# Patient Record
Sex: Male | Born: 1960 | Race: Black or African American | Hispanic: No | Marital: Married | Smoking: Never smoker
Health system: Southern US, Community
[De-identification: ages and names within clinical notes are randomized; demographics above are authoritative.]

## PROBLEM LIST (undated history)

## (undated) DIAGNOSIS — I10 Essential (primary) hypertension: Secondary | ICD-10-CM

## (undated) DIAGNOSIS — G4700 Insomnia, unspecified: Secondary | ICD-10-CM

## (undated) DIAGNOSIS — G4730 Sleep apnea, unspecified: Secondary | ICD-10-CM

## (undated) DIAGNOSIS — H30132 Disseminated chorioretinal inflammation, generalized, left eye: Secondary | ICD-10-CM

## (undated) DIAGNOSIS — T7840XA Allergy, unspecified, initial encounter: Secondary | ICD-10-CM

## (undated) DIAGNOSIS — K449 Diaphragmatic hernia without obstruction or gangrene: Secondary | ICD-10-CM

## (undated) DIAGNOSIS — M4800 Spinal stenosis, site unspecified: Secondary | ICD-10-CM

## (undated) DIAGNOSIS — K219 Gastro-esophageal reflux disease without esophagitis: Secondary | ICD-10-CM

## (undated) DIAGNOSIS — N200 Calculus of kidney: Secondary | ICD-10-CM

## (undated) DIAGNOSIS — K5730 Diverticulosis of large intestine without perforation or abscess without bleeding: Secondary | ICD-10-CM

## (undated) DIAGNOSIS — F32A Depression, unspecified: Secondary | ICD-10-CM

## (undated) DIAGNOSIS — R413 Other amnesia: Secondary | ICD-10-CM

## (undated) HISTORY — DX: Depression, unspecified: F32.A

## (undated) HISTORY — DX: Allergy, unspecified, initial encounter: T78.40XA

## (undated) HISTORY — DX: Other amnesia: R41.3

## (undated) HISTORY — DX: Spinal stenosis, site unspecified: M48.00

## (undated) HISTORY — DX: Insomnia, unspecified: G47.00

## (undated) HISTORY — PX: CATARACT EXTRACTION: SUR2

## (undated) HISTORY — DX: Calculus of kidney: N20.0

## (undated) HISTORY — DX: Disseminated chorioretinal inflammation, generalized, left eye: H30.132

## (undated) HISTORY — PX: RETINAL LASER PROCEDURE: SHX2339

## (undated) HISTORY — DX: Gastro-esophageal reflux disease without esophagitis: K21.9

## (undated) HISTORY — DX: Sleep apnea, unspecified: G47.30

## (undated) HISTORY — PX: COLONOSCOPY: SHX174

## (undated) HISTORY — DX: Diaphragmatic hernia without obstruction or gangrene: K44.9

## (undated) HISTORY — DX: Diverticulosis of large intestine without perforation or abscess without bleeding: K57.30

## (undated) HISTORY — DX: Essential (primary) hypertension: I10

---

## 1999-08-26 ENCOUNTER — Ambulatory Visit (HOSPITAL_COMMUNITY): Admission: RE | Admit: 1999-08-26 | Discharge: 1999-08-26 | Payer: Self-pay | Admitting: Internal Medicine

## 1999-08-26 ENCOUNTER — Encounter: Payer: Self-pay | Admitting: Internal Medicine

## 1999-09-04 ENCOUNTER — Encounter: Admission: RE | Admit: 1999-09-04 | Discharge: 1999-09-29 | Payer: Self-pay | Admitting: Internal Medicine

## 2003-11-04 ENCOUNTER — Ambulatory Visit (HOSPITAL_COMMUNITY): Admission: RE | Admit: 2003-11-04 | Discharge: 2003-11-04 | Payer: Self-pay | Admitting: Ophthalmology

## 2004-09-08 ENCOUNTER — Ambulatory Visit: Payer: Self-pay | Admitting: Internal Medicine

## 2004-12-21 ENCOUNTER — Ambulatory Visit: Payer: Self-pay | Admitting: Internal Medicine

## 2005-02-25 ENCOUNTER — Ambulatory Visit: Payer: Self-pay | Admitting: Gastroenterology

## 2005-06-22 ENCOUNTER — Ambulatory Visit: Payer: Self-pay | Admitting: Internal Medicine

## 2005-06-29 ENCOUNTER — Ambulatory Visit: Payer: Self-pay | Admitting: Internal Medicine

## 2006-02-01 ENCOUNTER — Emergency Department (HOSPITAL_COMMUNITY): Admission: EM | Admit: 2006-02-01 | Discharge: 2006-02-01 | Payer: Self-pay | Admitting: Emergency Medicine

## 2006-08-11 ENCOUNTER — Ambulatory Visit: Payer: Self-pay | Admitting: Internal Medicine

## 2006-08-11 LAB — CONVERTED CEMR LAB
ALT: 21 units/L (ref 0–40)
AST: 21 units/L (ref 0–37)
Albumin: 4 g/dL (ref 3.5–5.2)
Alkaline Phosphatase: 46 units/L (ref 39–117)
BUN: 19 mg/dL (ref 6–23)
Basophils Absolute: 0 10*3/uL (ref 0.0–0.1)
Basophils Relative: 0.3 % (ref 0.0–1.0)
Bilirubin, Direct: 0.1 mg/dL (ref 0.0–0.3)
CO2: 30 meq/L (ref 19–32)
Calcium: 9.6 mg/dL (ref 8.4–10.5)
Chloride: 104 meq/L (ref 96–112)
Cholesterol: 212 mg/dL (ref 0–200)
Creatinine, Ser: 1.6 mg/dL — ABNORMAL HIGH (ref 0.4–1.5)
Direct LDL: 135.4 mg/dL
Eosinophils Absolute: 0 10*3/uL (ref 0.0–0.6)
Eosinophils Relative: 0.8 % (ref 0.0–5.0)
GFR calc Af Amer: 60 mL/min
GFR calc non Af Amer: 49 mL/min
Glucose, Bld: 88 mg/dL (ref 70–99)
HCT: 46.1 % (ref 39.0–52.0)
HDL: 40.2 mg/dL (ref >39.0)
Hemoglobin: 16.1 g/dL (ref 13.0–17.0)
Lymphocytes Relative: 39.1 % (ref 12.0–46.0)
MCHC: 34.9 g/dL (ref 30.0–36.0)
MCV: 92.5 fL (ref 78.0–100.0)
Monocytes Absolute: 0.4 10*3/uL (ref 0.2–0.7)
Monocytes Relative: 9.1 % (ref 3.0–11.0)
Neutro Abs: 2.6 10*3/uL (ref 1.4–7.7)
Neutrophils Relative %: 50.7 % (ref 43.0–77.0)
PSA: 0.92 ng/mL (ref 0.10–4.00)
Platelets: 241 10*3/uL (ref 150–400)
Potassium: 3.9 meq/L (ref 3.5–5.1)
RBC: 4.98 M/uL (ref 4.22–5.81)
RDW: 12.8 % (ref 11.5–14.6)
Sodium: 139 meq/L (ref 135–145)
TSH: 0.89 microintl units/mL (ref 0.35–5.50)
Total Bilirubin: 0.9 mg/dL (ref 0.3–1.2)
Total CHOL/HDL Ratio: 5.3
Total Protein: 7.1 g/dL (ref 6.0–8.3)
Triglycerides: 173 mg/dL — ABNORMAL HIGH (ref 0–149)
VLDL: 35 mg/dL (ref 0–40)
WBC: 4.9 10*3/uL (ref 4.5–10.5)

## 2006-08-18 ENCOUNTER — Ambulatory Visit: Payer: Self-pay | Admitting: Internal Medicine

## 2007-01-03 ENCOUNTER — Ambulatory Visit: Payer: Self-pay | Admitting: Gastroenterology

## 2007-02-03 ENCOUNTER — Ambulatory Visit: Payer: Self-pay | Admitting: Gastroenterology

## 2007-02-03 ENCOUNTER — Encounter: Payer: Self-pay | Admitting: Internal Medicine

## 2007-02-28 ENCOUNTER — Ambulatory Visit: Payer: Self-pay | Admitting: Gastroenterology

## 2007-03-17 DIAGNOSIS — J309 Allergic rhinitis, unspecified: Secondary | ICD-10-CM | POA: Insufficient documentation

## 2007-03-17 DIAGNOSIS — I10 Essential (primary) hypertension: Secondary | ICD-10-CM | POA: Insufficient documentation

## 2007-05-23 ENCOUNTER — Encounter: Payer: Self-pay | Admitting: Internal Medicine

## 2007-05-29 ENCOUNTER — Telehealth: Payer: Self-pay | Admitting: Internal Medicine

## 2007-06-07 ENCOUNTER — Ambulatory Visit: Payer: Self-pay | Admitting: Internal Medicine

## 2007-06-07 DIAGNOSIS — E782 Mixed hyperlipidemia: Secondary | ICD-10-CM | POA: Insufficient documentation

## 2007-06-07 LAB — CONVERTED CEMR LAB
Cholesterol: 216 mg/dL (ref 0–200)
Direct LDL: 130.7 mg/dL
HDL: 35.9 mg/dL — ABNORMAL LOW (ref >39.0)
Total CHOL/HDL Ratio: 6
Triglycerides: 185 mg/dL — ABNORMAL HIGH (ref 0–149)
VLDL: 37 mg/dL (ref 0–40)

## 2007-06-12 ENCOUNTER — Ambulatory Visit: Payer: Self-pay | Admitting: Internal Medicine

## 2007-10-04 LAB — HM COLONOSCOPY: HM Colonoscopy: NORMAL

## 2008-01-22 ENCOUNTER — Telehealth: Payer: Self-pay | Admitting: Internal Medicine

## 2008-01-22 ENCOUNTER — Encounter: Payer: Self-pay | Admitting: Gastroenterology

## 2008-11-27 ENCOUNTER — Ambulatory Visit: Payer: Self-pay | Admitting: Internal Medicine

## 2008-11-27 LAB — CONVERTED CEMR LAB
ALT: 19 units/L (ref 0–53)
AST: 36 units/L (ref 0–37)
Albumin: 4.1 g/dL (ref 3.5–5.2)
Alkaline Phosphatase: 54 units/L (ref 39–117)
BUN: 19 mg/dL (ref 6–23)
Basophils Absolute: 0 10*3/uL (ref 0.0–0.1)
Basophils Relative: 1 % (ref 0.0–3.0)
Bilirubin Urine: NEGATIVE
Bilirubin, Direct: 0.1 mg/dL (ref 0.0–0.3)
Blood in Urine, dipstick: NEGATIVE
CO2: 32 meq/L (ref 19–32)
Calcium: 9.3 mg/dL (ref 8.4–10.5)
Chloride: 108 meq/L (ref 96–112)
Cholesterol: 200 mg/dL (ref 0–200)
Creatinine, Ser: 1.4 mg/dL (ref 0.4–1.5)
Eosinophils Absolute: 0 10*3/uL (ref 0.0–0.7)
Eosinophils Relative: 0.8 % (ref 0.0–5.0)
GFR calc non Af Amer: 69.12 mL/min (ref >60)
Glucose, Bld: 87 mg/dL (ref 70–99)
Glucose, Urine, Semiquant: NEGATIVE
HCT: 42 % (ref 39.0–52.0)
HDL: 37.2 mg/dL — ABNORMAL LOW (ref >39.00)
Hemoglobin: 14.4 g/dL (ref 13.0–17.0)
Ketones, urine, test strip: NEGATIVE
LDL Cholesterol: 138 mg/dL — ABNORMAL HIGH (ref 0–99)
Lymphocytes Relative: 45.2 % (ref 12.0–46.0)
Lymphs Abs: 1.8 10*3/uL (ref 0.7–4.0)
MCHC: 34.2 g/dL (ref 30.0–36.0)
MCV: 90.1 fL (ref 78.0–100.0)
Monocytes Absolute: 0.3 10*3/uL (ref 0.1–1.0)
Monocytes Relative: 8.8 % (ref 3.0–12.0)
Neutro Abs: 1.7 10*3/uL (ref 1.4–7.7)
Neutrophils Relative %: 44.2 % (ref 43.0–77.0)
Nitrite: NEGATIVE
PSA: 0.61 ng/mL (ref 0.10–4.00)
Platelets: 233 10*3/uL (ref 150.0–400.0)
Potassium: 3.9 meq/L (ref 3.5–5.1)
RBC: 4.66 M/uL (ref 4.22–5.81)
RDW: 13.9 % (ref 11.5–14.6)
Sodium: 144 meq/L (ref 135–145)
Specific Gravity, Urine: 1.02
TSH: 0.63 microintl units/mL (ref 0.35–5.50)
Total Bilirubin: 0.9 mg/dL (ref 0.3–1.2)
Total CHOL/HDL Ratio: 5
Total Protein: 7.3 g/dL (ref 6.0–8.3)
Triglycerides: 125 mg/dL (ref 0.0–149.0)
Urobilinogen, UA: 0.2
VLDL: 25 mg/dL (ref 0.0–40.0)
WBC Urine, dipstick: NEGATIVE
WBC: 3.8 10*3/uL — ABNORMAL LOW (ref 4.5–10.5)
pH: 5.5

## 2008-12-04 ENCOUNTER — Ambulatory Visit: Payer: Self-pay | Admitting: Internal Medicine

## 2008-12-20 ENCOUNTER — Encounter: Payer: Self-pay | Admitting: Gastroenterology

## 2009-01-17 ENCOUNTER — Telehealth: Payer: Self-pay | Admitting: Gastroenterology

## 2009-02-11 ENCOUNTER — Ambulatory Visit: Payer: Self-pay | Admitting: Gastroenterology

## 2010-05-07 ENCOUNTER — Ambulatory Visit: Payer: Self-pay | Admitting: Internal Medicine

## 2010-05-07 LAB — CONVERTED CEMR LAB
ALT: 21 units/L (ref 0–53)
AST: 26 units/L (ref 0–37)
Albumin: 4.2 g/dL (ref 3.5–5.2)
Alkaline Phosphatase: 57 units/L (ref 39–117)
BUN: 18 mg/dL (ref 6–23)
Basophils Absolute: 0 10*3/uL (ref 0.0–0.1)
Basophils Relative: 0.4 % (ref 0.0–3.0)
Bilirubin Urine: NEGATIVE
Bilirubin, Direct: 0.1 mg/dL (ref 0.0–0.3)
Blood in Urine, dipstick: NEGATIVE
CO2: 31 meq/L (ref 19–32)
Calcium: 9.7 mg/dL (ref 8.4–10.5)
Chloride: 101 meq/L (ref 96–112)
Cholesterol: 208 mg/dL — ABNORMAL HIGH (ref 0–200)
Creatinine, Ser: 1.7 mg/dL — ABNORMAL HIGH (ref 0.4–1.5)
Direct LDL: 124.5 mg/dL
Eosinophils Absolute: 0 10*3/uL (ref 0.0–0.7)
Eosinophils Relative: 0.6 % (ref 0.0–5.0)
GFR calc non Af Amer: 56.45 mL/min (ref >60)
Glucose, Bld: 82 mg/dL (ref 70–99)
Glucose, Urine, Semiquant: NEGATIVE
HCT: 46.5 % (ref 39.0–52.0)
HDL: 39.7 mg/dL (ref >39.00)
Hemoglobin: 16.1 g/dL (ref 13.0–17.0)
Lymphocytes Relative: 41.7 % (ref 12.0–46.0)
Lymphs Abs: 2.2 10*3/uL (ref 0.7–4.0)
MCHC: 34.7 g/dL (ref 30.0–36.0)
MCV: 92.5 fL (ref 78.0–100.0)
Monocytes Absolute: 0.4 10*3/uL (ref 0.1–1.0)
Monocytes Relative: 7.4 % (ref 3.0–12.0)
Neutro Abs: 2.6 10*3/uL (ref 1.4–7.7)
Neutrophils Relative %: 49.9 % (ref 43.0–77.0)
Nitrite: NEGATIVE
PSA: 0.82 ng/mL (ref 0.10–4.00)
Platelets: 230 10*3/uL (ref 150.0–400.0)
Potassium: 3.9 meq/L (ref 3.5–5.1)
RBC: 5.03 M/uL (ref 4.22–5.81)
RDW: 13.8 % (ref 11.5–14.6)
Sodium: 139 meq/L (ref 135–145)
Specific Gravity, Urine: 1.02
TSH: 0.92 microintl units/mL (ref 0.35–5.50)
Total Bilirubin: 0.7 mg/dL (ref 0.3–1.2)
Total CHOL/HDL Ratio: 5
Total Protein: 7.2 g/dL (ref 6.0–8.3)
Triglycerides: 185 mg/dL — ABNORMAL HIGH (ref 0.0–149.0)
Urobilinogen, UA: 0.2
VLDL: 37 mg/dL (ref 0.0–40.0)
WBC Urine, dipstick: NEGATIVE
WBC: 5.2 10*3/uL (ref 4.5–10.5)
pH: 5.5

## 2010-05-14 ENCOUNTER — Ambulatory Visit: Payer: Self-pay | Admitting: Internal Medicine

## 2010-08-04 NOTE — Progress Notes (Signed)
Summary: Protonix switch  Phone Note Call from Patient Call back at [phone]   Caller: Patient Call For: Dr. Jarold Motto Reason for Call: Talk to Nurse Details for Reason: Protonix switch Summary of Call: pt wanting to change from Protonix to a cheaper drug... pt filled out needed paperwork for this request, but says he hasnt heard back from anyone regarding this matter Initial call taken by: Vallarie Mare,  January 17, 2009 9:48 AM  Follow-up for Phone Call        Left message for patient to call back Darcey Nora RN, [facility]  January 17, 2009 10:11 AM    New/Updated Medications: PANTOPRAZOLE SODIUM 40 MG  TBEC (PANTOPRAZOLE SODIUM) 1 each day 30 minutes before meal Prescriptions: PANTOPRAZOLE SODIUM 40 MG  TBEC (PANTOPRAZOLE SODIUM) 1 each day 30 minutes before meal  #90 x 0   Entered by:   Darcey Nora RN, CGRN   Authorized by:   Mardella Layman MD FACG,FAGA   Signed by:   Darcey Nora RN, CGRN on 01/17/2009   Method used:   Electronically to        [facility] (mail-order)             ,          Ph: [phone]       Fax: [phone]   RxID:   [id number]

## 2010-08-04 NOTE — Progress Notes (Signed)
Summary: lmtcb for appt  05/29/2007, 11-25,11-26  Phone Note Outgoing Call Call back at Home Phone [phone]   Call placed by: Lynann Beaver CMA,  May 29, 2007 12:55 PM Call placed to: Patient Summary of Call: called pt to schedule an appt for [employer] physical form to be filled per Dr. Cato Mulligan.  Form is on Triage Board. Initial call taken by: Lynann Beaver CMA,  May 29, 2007 12:55 PM  Follow-up for Phone Call        [facility] 11-25 ..................................................................Marland KitchenRudy Jew, RN  May 30, 2007 12:21 PM [facility] 11-26 ..................................................................Marland KitchenRudy Jew, RN  May 31, 2007 8:19 AM   Additional Follow-up for Phone Call Additional follow up Details #1::        PT IS [facility] FOR 06-07-07 11AM Additional Follow-up by: Heron Sabins,  May 31, 2007 8:44 AM

## 2010-08-04 NOTE — Assessment & Plan Note (Signed)
Summary: ppi refill/sheri   History of Present Illness Visit Type: Follow-up Visit Primary GI MD: Cody Bison MD FACP FAGA Primary Provider: Leeanne Simmons Requesting Provider: n/a Chief Complaint: To follow up for new PPI Pantoprazole, may need new rx sent to [facility]. 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 He has chronic GERD and is on regular Protonix daily and is currently asymptomatic. He specifically denies chest pain or dysphagia,weight loss, lower gastrointestinal or hepatobiliary problems. He denies abuse of alcohol, cigarettes, or NSAIDs. He is followed by Dr. Cato Simmons and primary care. He had a negative colonoscopy 2 years ago. Family history is otherwise noncontributory. He denies any specific food intolerances.   GI Review of Systems      Denies abdominal pain, acid reflux, belching, bloating, chest pain, dysphagia with liquids, dysphagia with solids, heartburn, loss of appetite, nausea, vomiting, vomiting blood, weight loss, and  weight gain.        Denies anal fissure, black tarry stools, change in bowel habit, constipation, diarrhea, diverticulosis, fecal incontinence, heme positive stool, hemorrhoids, irritable bowel syndrome, jaundice, light color stool, liver problems, rectal bleeding, and  rectal pain.    Current Medications (verified): 1)  Fexofenadine Hcl 180 Mg  Tabs (Fexofenadine Hcl) .... Once Daily 2)  Cialis 20 Mg Tabs (Tadalafil) .... Take 1/2-1 Tablet By Mouth Once A Day As Needed 3)  Lisinopril-Hydrochlorothiazide 20-25 Mg Tabs (Lisinopril-Hydrochlorothiazide) .... Take 1 Tablet By Mouth Once A Day 4)  Paroxetine Hcl 10 Mg Tabs (Paroxetine Hcl) .... Take 1 Tablet By Mouth Every Other Night 5)  Rhinocort Aqua 32 Mcg/act Susp (Budesonide (Nasal)) .... Spray 2 Spray Into Both Nostrils Once A Day 6)  Astelin 137 Mcg/spray  Soln  (Azelastine Hcl) .... Once Daily 7)  Acular 0.5 %  Soln (Ketorolac Tromethamine) .... Once Daily 8)  Lotemax 0.5 %  Susp (Loteprednol Etabonate) .... Once Daily 9)  Pantoprazole Sodium 40 Mg  Tbec (Pantoprazole Sodium) .Marland Kitchen.. 1 Each Day 30 Minutes Before Meal  Allergies (verified): No Known Drug Allergies  Past History:  Past medical, surgical, family and social histories (including risk factors) reviewed for relevance to current acute and chronic problems.  Past Medical History: Reviewed history from 12/04/2008 and no changes required. Allergic rhinitis GERD Hypertension Acute Retnial Necrosis (left)  Past Surgical History: Reviewed history from 03/17/2007 and no changes required. Cataract extraction  Family History: Reviewed history from 12/04/2008 and no changes required. father-htn mother - htn, Kidney CA No FH of Colon Cancer:  Social History: Reviewed history from 12/04/2008 and no changes required. 1 child healthy Never Smoked Alcohol use-yes occ Occupation: Emergency planning/management officer Illicit Drug Use - no Drug Use:  no  Review of Systems       The patient complains of allergy/sinus.  The patient denies anemia, anxiety-new, arthritis/joint pain, back pain, blood in urine, breast changes/lumps, change in vision, confusion, cough, coughing up blood, depression-new, fainting, fatigue, fever, headaches-new, hearing problems, heart murmur, heart rhythm changes, itching, menstrual pain, muscle pains/cramps, night sweats, nosebleeds, shortness of breath, skin rash, sleeping problems, sore throat, swelling of feet/legs, swollen lymph glands, thirst - excessive, urination - excessive, urination changes/pain, urine leakage, vision changes, and voice change.    Vital Signs:  Patient profile:   50 year old male Height:      70 inches Weight:      209 pounds BMI:  30.10 BSA:     2.13 Pulse rate:   80 / minute Pulse rhythm:   regular BP sitting:   110 / 70  (left arm)  Vitals  Entered By: Cody Simmons CMA (AAMA) (February 11, 2009 3:15 PM)  Physical Exam  General:  Well developed, well nourished, no acute distress.healthy appearing.   Head:  Normocephalic and atraumatic. Eyes:  PERRLA, no icterus.exam deferred to patient's ophthalmologist.   Neck:  Supple; no masses or thyromegaly. Lungs:  Clear throughout to auscultation. Heart:  Regular rate and rhythm; no murmurs, rubs,  or bruits. Abdomen:  Soft, nontender and nondistended. No masses, hepatosplenomegaly or hernias noted. Normal bowel sounds. Extremities:  No clubbing, cyanosis, edema or deformities noted. Neurologic:  Alert and  oriented x4;  grossly normal neurologically. Psych:  Alert and cooperative. Normal mood and affect.   Impression & Recommendations:  Problem # 1:  GERD (ICD-530.81) Assessment Improved Continue daily PPI therapy and antireflux regime. There is no need for repeat endoscopy at this time. Previous endoscopies have shown a large hiatal hernia. He is followed by Dr. Cato Simmons he does routine blood screening.  Problem # 2:  DIVERTICULOSIS, COLON (ICD-562.10) Assessment: Unchanged high-fiber diet as tolerated.  Problem # 3:  HYPERTENSION (ICD-401.9) Assessment: Improved blood pressure today is 110/70 and he is to continue all meds as outlined and reviewed his record.  Patient Instructions: 1)  Copy sent to : Cody Simmons 2)  Please continue current medications.  3)  Avoid foods high in acid content ( tomatoes, citrus juices, spicy foods) . Avoid eating within 3 to 4 hours of lying down or before exercising. Do not over eat; try smaller more frequent meals. Elevate head of bed four inches when sleeping.  4)  Renew Protonix 40 mg 30 minutes before the first meal of the day.  Appended Document: ppi refill/sheri    Prescriptions: PANTOPRAZOLE SODIUM 40 MG  TBEC (PANTOPRAZOLE SODIUM) 1 each day 30 minutes before meal  #90 x 3   Entered by:   Cody Simmons CMA (AAMA)    Authorized by:   Cody Layman MD FACG,FAGA   Signed by:   Cody Simmons CMA (AAMA) on 02/11/2009   Method used:   Electronically to        [facility] (mail-order)             ,          Ph: [phone]       Fax: [phone]   RxID:   [id number]

## 2010-08-04 NOTE — Letter (Signed)
Summary: medical exam  medical exam   Imported By: Kassie Mends 06/09/2007 10:07:46  _____________________________________________________________________  External Attachment:    Type:   Image     Comment:   medical exam

## 2010-08-04 NOTE — Assessment & Plan Note (Signed)
Summary: WANTS EKG/CCM    Chief Complaint:  needs EKG for form--also needs copy of tracing with interpretation.  History of Present Illness: needed ekg for form completion  Current Allergies (reviewed today): No known allergies         Complete Medication List: 1)  Fexofenadine Hcl 180 Mg Tabs (Fexofenadine hcl) .... Once daily 2)  Cialis 20 Mg Tabs (Tadalafil) .... Take 1/2-1 tablet by mouth once a day 3)  Lisinopril-hydrochlorothiazide 20-25 Mg Tabs (Lisinopril-hydrochlorothiazide) .... Take 1 tablet by mouth once a day 4)  Paroxetine Hcl 10 Mg Tabs (Paroxetine hcl) .... Take 1 tablet by mouth every other night 5)  Protonix 40 Mg Tbec (Pantoprazole sodium) .... Take 1 tablet by mouth every morning 6)  Rhinocort Aqua 32 Mcg/act Susp (Budesonide (nasal)) .... Spray 2 spray into both nostrils once a day 7)  Astelin 137 Mcg/spray Soln (Azelastine hcl) .... Once daily 8)  Acular 0.5 % Soln (Ketorolac tromethamine) .... Once daily 9)  Lotemax 0.5 % Susp (Loteprednol etabonate) .... Once daily     ]

## 2010-08-04 NOTE — Miscellaneous (Signed)
Summary: Protonix refill [facility]  Clinical Lists Changes  Medications: Changed medication from PROTONIX 40 MG TBEC (PANTOPRAZOLE SODIUM) Take 1 tablet by mouth every morning to PROTONIX 40 MG TBEC (PANTOPRAZOLE SODIUM) Take 1 tablet by mouth every morning - Signed Rx of PROTONIX 40 MG TBEC (PANTOPRAZOLE SODIUM) Take 1 tablet by mouth every morning;  #90 x 3;  Signed;  Entered by: Harlow Mares CMA;  Authorized by: Mardella Layman MD [facility];  Method used: Faxed to [facility], , , [place]  , Ph: , Fax: [phone]    Prescriptions: PROTONIX 40 MG TBEC (PANTOPRAZOLE SODIUM) Take 1 tablet by mouth every morning  #90 x 3   Entered by:   Harlow Mares CMA   Authorized by:   Mardella Layman MD FACG,FAGA   Signed by:   Harlow Mares CMA on 01/22/2008   Method used:   Faxed to ...       [facility]             , [place]         Ph:        Fax: [phone]   RxID:   [id number]

## 2010-08-04 NOTE — Procedures (Signed)
Summary: Gastroenterology EGD  Gastroenterology EGD   Imported By: Milford Cage CMA 10/05/2007 15:19:41  _____________________________________________________________________  External Attachment:    Type:   Image     Comment:   External Document

## 2010-08-04 NOTE — Assessment & Plan Note (Signed)
Summary: CPX // RS   Vital Signs:  Patient profile:   50 year old male Height:      70 inches Weight:      209 pounds BMI:     30.10 Temp:     98.7 degrees F oral Pulse rate:   76 / minute BP sitting:   104 / 76  (left arm) Cuff size:   large  Vitals Entered By: Alfred Levins, CMA (May 14, 2010 10:21 AM) CC: cpx   Primary Care Provider:  Leeanne Deed  CC:  cpx.  History of Present Illness: cpx  Current Problems (verified): 1)  Preventive Health Care  (ICD-V70.0) 2)  Hiatal Hernia  (ICD-553.3) 3)  Diverticulosis, Colon  (ICD-562.10) 4)  Mixed Hyperlipidemia  (ICD-272.2) 5)  Hypertension  (ICD-401.9) 6)  Allergic Rhinitis  (ICD-477.9)  Current Medications (verified): 1)  Claritin 10 Mg Tabs (Loratadine) .... Once Daily 2)  Cialis 20 Mg Tabs (Tadalafil) .... Take 1/2-1 Tablet By Mouth Once A Day As Needed 3)  Lisinopril-Hydrochlorothiazide 20-25 Mg Tabs (Lisinopril-Hydrochlorothiazide) .... Take 1 Tablet By Mouth Once A Day 4)  Paroxetine Hcl 10 Mg Tabs (Paroxetine Hcl) .... Take 1 Tablet By Mouth Every Other Night--Needs Office Visit For Additional Refills 5)  Rhinocort Aqua 32 Mcg/act Susp (Budesonide (Nasal)) .... Spray 2 Spray Into Both Nostrils Once A Day 6)  Astelin 137 Mcg/spray  Soln (Azelastine Hcl) .... Once Daily 7)  Acular 0.5 %  Soln (Ketorolac Tromethamine) .... Once Daily 8)  Lotemax 0.5 %  Susp (Loteprednol Etabonate) .... Once Daily 9)  Pantoprazole Sodium 40 Mg  Tbec (Pantoprazole Sodium) .Marland Kitchen.. 1 Each Day 30 Minutes Before Meal  Allergies (verified): No Known Drug Allergies  Past History:  Past Medical History: Last updated: 12/04/2008 Allergic rhinitis GERD Hypertension Acute Retnial Necrosis (left)  Past Surgical History: Last updated: 03/17/2007 Cataract extraction  Family History: Last updated: 02/11/2009 father-htn mother - htn, Kidney CA No FH of Colon Cancer:  Social History: Last updated: 02/11/2009 1 child  healthy Never Smoked Alcohol use-yes occ Occupation: Emergency planning/management officer Illicit Drug Use - no  Risk Factors: Smoking Status: never (12/04/2008)  Physical Exam  General:  alert and well-developed.   Head:  normocephalic and atraumatic.   Eyes:  pupils equal and pupils round.   Ears:  R ear normal and L ear normal.   Neck:  supple and full ROM.   Lungs:  normal respiratory effort and no intercostal retractions.   Heart:  normal rate and regular rhythm.   Abdomen:  Soft, nontender and nondistended. No masses, hepatosplenomegaly or hernias noted. Normal bowel sounds. Prostate:  no nodules and no asymmetry.   Neurologic:  cranial nerves II-XII intact and gait normal.     Impression & Recommendations:  Problem # 1:  PREVENTIVE HEALTH CARE (ICD-V70.0) health maintenance is up-to-date. He needs to work on weight loss. I've advised the low calorie diet. He should exercise daily for at least 45 minutes  Problem # 2:  MIXED HYPERLIPIDEMIA (ICD-272.2)  Labs Reviewed: SGOT: 26 (05/07/2010)   SGPT: 21 (05/07/2010)   HDL:39.70 (05/07/2010), 37.20 (11/27/2008)  LDL:138 (11/27/2008), DEL (06/07/2007)  Chol:208 (05/07/2010), 200 (11/27/2008)  Trig:185.0 (05/07/2010), 125.0 (11/27/2008)  Problem # 3:  HIATAL HERNIA (ICD-553.3)  His updated medication list for this problem includes:    Pantoprazole Sodium 40 Mg Tbec (Pantoprazole sodium) .Marland Kitchen... 1 each day 30 minutes before meal  Complete Medication List: 1)  Claritin 10 Mg Tabs (Loratadine) .... Once daily 2)  Cialis  20 Mg Tabs (Tadalafil) .... Take 1/2-1 tablet by mouth once a day as needed 3)  Lisinopril-hydrochlorothiazide 20-25 Mg Tabs (Lisinopril-hydrochlorothiazide) .... Take 1 tablet by mouth once a day 4)  Paroxetine Hcl 10 Mg Tabs (Paroxetine hcl) .... Take 1 tablet by mouth every other night--needs office visit for additional refills 5)  Rhinocort Aqua 32 Mcg/act Susp (Budesonide (nasal)) .... Spray 2 spray into both nostrils once a  day 6)  Astelin 137 Mcg/spray Soln (Azelastine hcl) .... Once daily 7)  Acular 0.5 % Soln (Ketorolac tromethamine) .... Once daily 8)  Lotemax 0.5 % Susp (Loteprednol etabonate) .... Once daily 9)  Pantoprazole Sodium 40 Mg Tbec (Pantoprazole sodium) .Marland Kitchen.. 1 each day 30 minutes before meal  Patient Instructions: 1)  3 months-Labs only 2)  bmet--995.2 3)  If we don't call you with results within 5 days call us back   Orders Added: 1)  Est. Patient 40-64 years [[id number]]     Preventive Care Screening  Colonoscopy:    Date:  10/04/2007    Next Due:  10/2017    Results:  normal-pt's report

## 2010-08-04 NOTE — Progress Notes (Signed)
Summary: [facility] 90 day Rx request  Phone Note Call from Patient Call back at Home Phone [phone]   Caller: Patient Call For: Swords/Jenkins Summary of Call: Pt dropped of at the office [facility] Rx paperwork request for 2 med he would like to begin getting through [facility].   Cialis 20 mg 90 day supply would save him money Paroxetine HCL 10 mg can be changed to Fluoxetine HCL 10 mg one daily would save him money  Initial call taken by: Sid Falcon LPN,  January 22, 2008 2:44 PM  Follow-up for Phone Call        Rx faxed to [facility].Patient notified.  Follow-up by: Gladis Riffle, RN,  February 14, 2008 12:40 PM      Prescriptions: PAROXETINE HCL 10 MG TABS (PAROXETINE HCL) Take 1 tablet by mouth every other night  #90 x 2   Entered by:   Gladis Riffle, RN   Authorized by:   Birdie Sons MD   Signed by:   Gladis Riffle, RN on 02/14/2008   Method used:   Faxed to ...       [facility]             , [place]         Ph:        Fax: [phone]   RxID:   [id number] CIALIS 20 MG TABS (TADALAFIL) Take 1/2-1 tablet by mouth once a day as needed  #18 x 2   Entered by:   Gladis Riffle, RN   Authorized by:   Birdie Sons MD   Signed by:   Gladis Riffle, RN on 02/14/2008   Method used:   Faxed to ...       [facility]             , [place]         Ph:        Fax: [phone]   RxID:   [id number]

## 2010-08-04 NOTE — Assessment & Plan Note (Signed)
Summary: CPX/MHF   Vital Signs:  Patient profile:   50 year old male Height:      70 inches Weight:      200 pounds BMI:     28.80 Pulse rate:   60 / minute Pulse rhythm:   regular Resp:     12 per minute BP sitting:   110 / 78  (left arm)  Vitals Entered By: Gladis Riffle, RN (December 04, 2008 10:10 AM)  Nutrition Counseling: Patient's BMI is greater than 25 and therefore counseled on weight management options.  History of Present Illness: cpx  new problem: right ankle pain---increase pain with activity---ongoing for two months. Medial aspect  of ankle. Tends to worsen weveral hours after vigorous activity    Preventive Screening-Counseling & Management  Alcohol-Tobacco     Smoking Status: never  Current Problems (verified): 1)  Hiatal Hernia  (ICD-553.3) 2)  Diverticulosis, Colon  (ICD-562.10) 3)  Mixed Hyperlipidemia  (ICD-272.2) 4)  Hypertension  (ICD-401.9) 5)  Gerd  (ICD-530.81) 6)  Allergic Rhinitis  (ICD-477.9)  Current Medications (verified): 1)  Fexofenadine Hcl 180 Mg  Tabs (Fexofenadine Hcl) .... Once Daily 2)  Cialis 20 Mg Tabs (Tadalafil) .... Take 1/2-1 Tablet By Mouth Once A Day As Needed 3)  Lisinopril-Hydrochlorothiazide 20-25 Mg Tabs (Lisinopril-Hydrochlorothiazide) .... Take 1 Tablet By Mouth Once A Day 4)  Paroxetine Hcl 10 Mg Tabs (Paroxetine Hcl) .... Take 1 Tablet By Mouth Every Other Night 5)  Protonix 40 Mg Tbec (Pantoprazole Sodium) .... Take 1 Tablet By Mouth Every Morning 6)  Rhinocort Aqua 32 Mcg/act Susp (Budesonide (Nasal)) .... Spray 2 Spray Into Both Nostrils Once A Day 7)  Astelin 137 Mcg/spray  Soln (Azelastine Hcl) .... Once Daily 8)  Acular 0.5 %  Soln (Ketorolac Tromethamine) .... Once Daily 9)  Lotemax 0.5 %  Susp (Loteprednol Etabonate) .... Once Daily  Allergies (verified): No Known Drug Allergies  Comments:  Nurse/Medical Assistant: cpx, labs done--c/o right ankle pain x 4 months, worse last month so states is  "unbearable" The patient's medications and allergies were reviewed with the patient and were updated in the Medication and Allergy Lists. Gladis Riffle, RN (December 04, 2008 10:11 AM)  Past History:  Past Surgical History: Last updated: 03/17/2007 Cataract extraction  Risk Factors: Smoking Status: never (12/04/2008)  Past Medical History: Allergic rhinitis GERD Hypertension Acute Retnial Necrosis (left)  Family History: father-htn mother - htn, Kidney CA  Social History: 1 child healthy Never Smoked Alcohol use-yes  Review of Systems       All other systems reviewed and were negative    Impression & Recommendations:  Problem # 1:  Preventive Health Care (ICD-V70.0)  Complete Medication List: 1)  Fexofenadine Hcl 180 Mg Tabs (Fexofenadine hcl) .... Once daily 2)  Cialis 20 Mg Tabs (Tadalafil) .... Take 1/2-1 tablet by mouth once a day as needed 3)  Lisinopril-hydrochlorothiazide 20-25 Mg Tabs (Lisinopril-hydrochlorothiazide) .... Take 1 tablet by mouth once a day 4)  Paroxetine Hcl 10 Mg Tabs (Paroxetine hcl) .... Take 1 tablet by mouth every other night 5)  Protonix 40 Mg Tbec (Pantoprazole sodium) .... Take 1 tablet by mouth every morning 6)  Rhinocort Aqua 32 Mcg/act Susp (Budesonide (nasal)) .... Spray 2 spray into both nostrils once a day 7)  Astelin 137 Mcg/spray Soln (Azelastine hcl) .... Once daily 8)  Acular 0.5 % Soln (Ketorolac tromethamine) .... Once daily 9)  Lotemax 0.5 % Susp (Loteprednol etabonate) .... Once daily  Other Orders: T-Ankle  Comp Right (73610TC)  Physical Exam General Appearance: well developed, well nourished, no acute distress Eyes: conjunctiva and lids normal, PERRL, EOMI, Ears, Nose, Mouth, Throat: TM clear, nares clear, oral exam WNL Neck: supple, no lymphadenopathy, no thyromegaly, no JVD Respiratory: clear to auscultation and percussion, respiratory effort normal Cardiovascular: regular rate and rhythm, S1-S2, no murmur, rub or  gallop, no bruits, peripheral pulses normal and symmetric, no cyanosis, clubbing, edema or varicosities Chest: no scars, masses, tenderness; no asymmetry, skin changes, nipple discharge, no gynecomastia   Gastrointestinal: soft, non-tender; no hepatosplenomegaly, masses; active bowel sounds all quadrants, guaiac negative stool; no masses, tenderness, hemorrhoids  Genitourinary: no hernia, testicular mass,  or prostate enlargement Lymphatic: no cervical, axillary or inguinal adenopathy Musculoskeletal: gait normal, muscle tone and strength WNL, no joint swelling, effusions, discoloration, crepitus  Skin: clear, good turgor, color WNL, no rashes, lesions, or ulcerations Neurologic: normal mental status, normal reflexes, normal strength, sensation, and motion Psychiatric: alert; oriented to person, place and time Other Exam:   Appended Document: CPX/MHF   Tetanus/Td Vaccine    Vaccine Type: Tdap    Site: left deltoid    Mfr: GlaxoSmithKline    Dose: 0.5 ml    Route: IM    Given by: Gladis Riffle, RN    Exp. Date: 08/28/2010    Lot #: [id number]

## 2010-08-04 NOTE — Procedures (Signed)
Summary: Gastroenterology colonoscopy  Gastroenterology colonoscopy   Imported By: Milford Cage CMA 10/05/2007 15:15:22  _____________________________________________________________________  External Attachment:    Type:   Image     Comment:   External Document

## 2010-08-04 NOTE — Assessment & Plan Note (Signed)
Summary: FORM COMPLETION/NJR   Vital Signs:  Patient Profile:   50 Years Old Male Height:     69.5 inches Weight:      208 pounds Temp:     98.3 degrees F oral Pulse rate:   80 / minute BP sitting:   118 / 80  (left arm)  Vitals Entered By: Gladis Riffle, RN (June 07, 2007 11:22 AM)                 Chief Complaint:  form completion--cpx done 02/08.  Current Allergies (reviewed today): No known allergies         Impression & Recommendations:  Problem # 1:  HYPERTENSION (ICD-401.9) 30 minute form completion. [employer] training. PPD His updated medication list for this problem includes:    Lisinopril-hydrochlorothiazide 20-25 Mg Tabs (Lisinopril-hydrochlorothiazide) .Marland Kitchen... Take 1 tablet by mouth once a day   Complete Medication List: 1)  Fexofenadine Hcl 180 Mg Tabs (Fexofenadine hcl) .... Once daily 2)  Cialis 20 Mg Tabs (Tadalafil) .... Take 1/2-1 tablet by mouth once a day 3)  Lisinopril-hydrochlorothiazide 20-25 Mg Tabs (Lisinopril-hydrochlorothiazide) .... Take 1 tablet by mouth once a day 4)  Paroxetine Hcl 10 Mg Tabs (Paroxetine hcl) .... Take 1 tablet by mouth every other night 5)  Protonix 40 Mg Tbec (Pantoprazole sodium) .... Take 1 tablet by mouth every morning 6)  Rhinocort Aqua 32 Mcg/act Susp (Budesonide (nasal)) .... Spray 2 spray into both nostrils once a day 7)  Astelin 137 Mcg/spray Soln (Azelastine hcl) .... Once daily 8)  Acular 0.5 % Soln (Ketorolac tromethamine) .... Once daily 9)  Lotemax 0.5 % Susp (Loteprednol etabonate) .... Once daily  Other Orders: Venipuncture (62952) TLB-Lipid Panel (80061-LIPID) TB Skin Test (84132)     ]  PPD Application    Vaccine Type: PPD    Site: right forearm    Mfr: Sanofi Pasteur    Dose: 0.1 ml    Route: ID    Given by: Gladis Riffle, RN    Exp. Date: 02/25/2009    Lot #: [id number]  PPD Results    Date of reading: 06/09/2007    Results: < 5mm    Interpretation: negative

## 2010-08-07 NOTE — Procedures (Signed)
Summary: Colonoscopy/[facility]  Colonoscopy/[facility]   Imported By: Lanelle Bal 05/14/2010 12:01:04  _____________________________________________________________________  External Attachment:    Type:   Image     Comment:   External Document

## 2010-08-11 ENCOUNTER — Other Ambulatory Visit (INDEPENDENT_AMBULATORY_CARE_PROVIDER_SITE_OTHER): Payer: 59 | Admitting: Internal Medicine

## 2010-08-11 DIAGNOSIS — T887XXA Unspecified adverse effect of drug or medicament, initial encounter: Secondary | ICD-10-CM

## 2010-08-11 LAB — BASIC METABOLIC PANEL
BUN: 21 mg/dL (ref 6–23)
CO2: 29 mEq/L (ref 19–32)
Calcium: 9.6 mg/dL (ref 8.4–10.5)
Chloride: 107 mEq/L (ref 96–112)
Creatinine, Ser: 1.6 mg/dL — ABNORMAL HIGH (ref 0.4–1.5)
GFR: 60.14 mL/min (ref >60.00)
Glucose, Bld: 59 mg/dL — ABNORMAL LOW (ref 70–99)
Potassium: 3.8 mEq/L (ref 3.5–5.1)
Sodium: 142 mEq/L (ref 135–145)

## 2010-10-06 ENCOUNTER — Other Ambulatory Visit: Payer: Self-pay | Admitting: Internal Medicine

## 2010-11-17 NOTE — Assessment & Plan Note (Signed)
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  He denies GI complaints except for occasional hiccups.  He denies  dysphagia or chest pain.  He has a family history of colon polyps in his  mother, but never had colonoscopy exam.  He denies lower  gastrointestinal problems.   He sees Dr. Cato Mulligan for his general medical care and is on some nasal  sprays, a medicine called Acular 3 times a day, lisinopril daily, and a  possible anti-depressant.  Did not bring his medications today for  review.   He weighs 207 pounds with blood pressure 112/80.  Pulse was 80 and  regular.  General physical exam was not performed.   ASSESSMENT:  1. Chronic gastroesophageal reflux disease - rule out Barrett's      mucosa.  2. Need for screening colonoscopy for his age and family history.   RECOMMENDATIONS:  1. Renew Protonix and use twice a day as needed.  2. Outpatient endoscopy and colonoscopy at his convenience.  3. Will better list his medications when he returns for colonoscopy      exam.  I have asked him to bring a list of his medications for that      visit.     Vania Rea. Jarold Motto, MD, Caleen Essex, FAGA  Electronically Signed    DRP/MedQ  DD: 01/03/2007  DT: 01/03/2007  Job #: [id number]   cc:   Valetta Mole. Swords, MD

## 2010-11-20 NOTE — Op Note (Signed)
NAMEKADYN, GUILD                        ACCOUNT NO.:  [id number]   MEDICAL RECORD NO.:  [id number]                   PATIENT TYPE:  OIB   LOCATION:  2889                                 FACILITY:  [facility]   PHYSICIAN:  Alford Highland. Rankin, M.D.                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   POSTOPERATIVE DIAGNOSIS:  1. Epiretinal membrane of the left eye with severe topographical distortion     of the macular region and the optic nerve.  2. 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   OPERATION PERFORMED:  1. Posterior vitrectomy and membrane peel, left eye.  2. Endolaser panphotocoagulation for retinopexy purposes.   SURGEON:  Alford Highland. Rankin, M.D.   ANESTHESIA:  General endotracheal.   INDICATIONS FOR PROCEDURE:  The patient is a 50 year old man who has  profound vision loss on the basis of severe macular topographical  distortion, anterior and posterior tractional retinal detachment on the  macular region. This was from vitreomacular retraction syndrome.  The  patient understands that this is an attempt to release the traction.  He  understands the risks of anesthesia including the rare occurrence of death  but also to the eye including but not limited to hemorrhage, infection,  scarring, need for another surgery, no change in vision, loss of vision and  progression of disease despite intervention.  After appropriate signed  consent was obtained, the patient was taken to the operating room.   DESCRIPTION OF PROCEDURE:  In the operating room, general endotracheal  anesthesia was instituted without difficulty.  The left periocular region  was sterilely prepped and draped in the usual  ophthalmic fashion.  A lid  speculum was applied.  Conjunctival peritomy was fashioned superiorly and  temporally.  A 4 mm infusion secured 3.5 mm posterior to the limbus in the  infratemporal quadrant.  Placement in the vitreous cavity verified visually.  Superior sclerotomies then fashioned.  Wild microscope was placed in  position with the Biom attachment.  Core vitrectomy was then begun.  A  modified en bloc technique was then used to trim the severe epiretinal  tissues off the macular region.  No attempt was made to release the traction  on the optic nerve because of its densely adherent nature and the risks of  vascular damage.  This area was trimmed, however.  Peripheral vitreous was  then elevated to the midperiphery where dense vitreoretinal attachments and  scarring precluded any attempt at aggressive release of the traction  anterior to the equator.  Endolaser panphotocoagulation was placed posterior  to the previous chorioretinal scarring.  Peripheral retina was inspected and  found to be without new holes or tears.  The instruments  were removed from the eye.  Superior sclerotomies were then closed with 7-0  Vicryl. Conjunctiva was also closed.  The infusion removed and similarly  closed.  Subconjunctival injection of antibiotic and steroid applied.  The  patient tolerated the procedure well without complication.                                               Alford Highland Rankin, M.D.    GAR/MEDQ  D:  11/04/2003  T:  11/05/2003  Job:  [id number]   cc:   Remi Deter L. Chales Abrahams, M.D.  [address]erryn[address], [place]  [place]  [place] [address]  Fax: [phone]

## 2011-03-31 ENCOUNTER — Other Ambulatory Visit: Payer: Self-pay | Admitting: Internal Medicine

## 2011-06-06 ENCOUNTER — Other Ambulatory Visit: Payer: Self-pay | Admitting: Internal Medicine

## 2011-06-19 ENCOUNTER — Other Ambulatory Visit: Payer: Self-pay | Admitting: Gastroenterology

## 2011-07-19 ENCOUNTER — Other Ambulatory Visit (INDEPENDENT_AMBULATORY_CARE_PROVIDER_SITE_OTHER): Payer: 59

## 2011-07-19 DIAGNOSIS — Z0000 Encounter for general adult medical examination without abnormal findings: Secondary | ICD-10-CM

## 2011-07-19 LAB — CBC WITH DIFFERENTIAL/PLATELET
Basophils Absolute: 0 10*3/uL (ref 0.0–0.1)
Basophils Relative: 0.7 % (ref 0.0–3.0)
Eosinophils Absolute: 0 10*3/uL (ref 0.0–0.7)
Eosinophils Relative: 0.8 % (ref 0.0–5.0)
HCT: 45 % (ref 39.0–52.0)
Hemoglobin: 15.4 g/dL (ref 13.0–17.0)
Lymphocytes Relative: 39.7 % (ref 12.0–46.0)
Lymphs Abs: 1.6 10*3/uL (ref 0.7–4.0)
MCHC: 34.3 g/dL (ref 30.0–36.0)
MCV: 92.5 fl (ref 78.0–100.0)
Monocytes Absolute: 0.3 10*3/uL (ref 0.1–1.0)
Monocytes Relative: 8.1 % (ref 3.0–12.0)
Neutro Abs: 2 10*3/uL (ref 1.4–7.7)
Neutrophils Relative %: 50.7 % (ref 43.0–77.0)
Platelets: 264 10*3/uL (ref 150.0–400.0)
RBC: 4.87 Mil/uL (ref 4.22–5.81)
RDW: 13.7 % (ref 11.5–14.6)
WBC: 4 10*3/uL — ABNORMAL LOW (ref 4.5–10.5)

## 2011-07-19 LAB — HEPATIC FUNCTION PANEL
ALT: 21 U/L (ref 0–53)
AST: 25 U/L (ref 0–37)
Albumin: 4.3 g/dL (ref 3.5–5.2)
Alkaline Phosphatase: 54 U/L (ref 39–117)
Bilirubin, Direct: 0.1 mg/dL (ref 0.0–0.3)
Total Bilirubin: 1.2 mg/dL (ref 0.3–1.2)
Total Protein: 7.2 g/dL (ref 6.0–8.3)

## 2011-07-19 LAB — BASIC METABOLIC PANEL
BUN: 14 mg/dL (ref 6–23)
CO2: 26 mEq/L (ref 19–32)
Calcium: 9.2 mg/dL (ref 8.4–10.5)
Chloride: 99 mEq/L (ref 96–112)
Creatinine, Ser: 1.6 mg/dL — ABNORMAL HIGH (ref 0.4–1.5)
GFR: 60.36 mL/min (ref >60.00)
Glucose, Bld: 94 mg/dL (ref 70–99)
Potassium: 3.5 mEq/L (ref 3.5–5.1)
Sodium: 139 mEq/L (ref 135–145)

## 2011-07-19 LAB — LIPID PANEL
Cholesterol: 237 mg/dL — ABNORMAL HIGH (ref 0–200)
HDL: 38.9 mg/dL — ABNORMAL LOW (ref >39.00)
Total CHOL/HDL Ratio: 6
Triglycerides: 169 mg/dL — ABNORMAL HIGH (ref 0.0–149.0)
VLDL: 33.8 mg/dL (ref 0.0–40.0)

## 2011-07-19 LAB — POCT URINALYSIS DIPSTICK
Bilirubin, UA: NEGATIVE
Blood, UA: NEGATIVE
Glucose, UA: NEGATIVE
Ketones, UA: NEGATIVE
Nitrite, UA: NEGATIVE
Protein, UA: NEGATIVE
Spec Grav, UA: 1.02
Urobilinogen, UA: 0.2
pH, UA: 5.5

## 2011-07-19 LAB — TSH: TSH: 0.37 u[IU]/mL (ref 0.35–5.50)

## 2011-07-19 LAB — LDL CHOLESTEROL, DIRECT: Direct LDL: 156 mg/dL

## 2011-07-19 LAB — PSA: PSA: 0.73 ng/mL (ref 0.10–4.00)

## 2011-07-26 ENCOUNTER — Encounter: Payer: Self-pay | Admitting: Internal Medicine

## 2011-07-26 ENCOUNTER — Ambulatory Visit (INDEPENDENT_AMBULATORY_CARE_PROVIDER_SITE_OTHER): Payer: 59 | Admitting: Internal Medicine

## 2011-07-26 DIAGNOSIS — E782 Mixed hyperlipidemia: Secondary | ICD-10-CM

## 2011-07-26 DIAGNOSIS — Z0000 Encounter for general adult medical examination without abnormal findings: Secondary | ICD-10-CM

## 2011-07-26 NOTE — Assessment & Plan Note (Signed)
Discussed need for improved diet, daily exerc ise and weight loss

## 2011-07-26 NOTE — Progress Notes (Signed)
Patient ID: Cody Simmons, male   DOB: 1960/07/14, 51 y.o.   MRN: [id number] cpx  Past Medical History  Diagnosis Date  . Allergy   . GERD (gastroesophageal reflux disease)   . Hypertension   . Retinal necrosis, acute, left     History   Social History  . Marital Status: Divorced    Spouse Name: N/A    Number of Children: N/A  . Years of Education: N/A   Occupational History  . Not on file.   Social History Main Topics  . Smoking status: Never Smoker   . Smokeless tobacco: Not on file  . Alcohol Use: Yes  . Drug Use: No  . Sexually Active:    Other Topics Concern  . Not on file   Social History Narrative  . No narrative on file    Past Surgical History  Procedure Date  . Cataract extraction     Family History  Problem Relation Age of Onset  . Hypertension Mother   . Cancer Mother     kidney  . Hypertension Father     No Known Allergies  Current Outpatient Prescriptions on File Prior to Visit  Medication Sig Dispense Refill  . lisinopril-hydrochlorothiazide (PRINZIDE,ZESTORETIC) 20-25 MG per tablet TAKE 1 TABLET DAILY  90 tablet  0  . pantoprazole (PROTONIX) 40 MG tablet Take one tablet by mouth once a day.... MUST HAVE OFFICE VISIT  90 tablet  0  . PARoxetine (PAXIL) 10 MG tablet TAKE 1 TABLET EVERY OTHER NIGHT. (NEED OFFICE VISIT FOR ADDITIONAL REFILLS)  90 tablet  1     patient denies chest pain, shortness of breath, orthopnea. Denies lower extremity edema, abdominal pain, change in appetite, change in bowel movements. Patient denies rashes, musculoskeletal complaints. No other specific complaints in a complete review of systems.   BP 122/82  Pulse 76  Temp(Src) 98.1 F (36.7 C) (Oral)  Ht 5\' 10"  (1.778 m)  Wt 203 lb (92.08 kg)  BMI 29.13 kg/m2 Well-developed male in no acute distress. HEENT exam atraumatic, normocephalic, extraocular muscles are intact. Conjunctivae are pink without exudate. Neck is supple without lymphadenopathy, thyromegaly,  jugular venous distention. Chest is clear to auscultation without increased work of breathing. Cardiac exam S1-S2 are regular. The PMI is normal. No significant murmurs or gallops. Abdominal exam active bowel sounds, soft, nontender. No abdominal bruits. Extremities no clubbing cyanosis or edema. Peripheral pulses are normal without bruits. Neurologic exam alert and oriented without any motor or sensory deficits.    A/P: Well Visit: health maint UTD

## 2011-07-29 ENCOUNTER — Other Ambulatory Visit: Payer: Self-pay | Admitting: Internal Medicine

## 2011-09-04 ENCOUNTER — Other Ambulatory Visit: Payer: Self-pay | Admitting: Internal Medicine

## 2011-09-17 ENCOUNTER — Telehealth: Payer: Self-pay | Admitting: Internal Medicine

## 2011-09-17 ENCOUNTER — Other Ambulatory Visit: Payer: Self-pay | Admitting: Gastroenterology

## 2011-09-17 DIAGNOSIS — I10 Essential (primary) hypertension: Secondary | ICD-10-CM

## 2011-09-17 MED ORDER — LISINOPRIL-HYDROCHLOROTHIAZIDE 20-25 MG PO TABS: 1.0000 | ORAL_TABLET | Freq: Every day | ORAL | Status: DC

## 2011-09-17 NOTE — Telephone Encounter (Signed)
30 day supply sent to [facility] and 90 day sent in to [facility] electronically

## 2011-09-17 NOTE — Telephone Encounter (Signed)
Pt req refill of lisinopril-hydrochlorothiazide (PRINZIDE,ZESTORETIC) 20-25 MG per tablet to [facility] mail order pharmacy. Pt is completely out med of med and needs this to also be called in to [facility] on [address].

## 2011-09-20 ENCOUNTER — Other Ambulatory Visit: Payer: Self-pay | Admitting: *Deleted

## 2011-09-20 DIAGNOSIS — I10 Essential (primary) hypertension: Secondary | ICD-10-CM

## 2011-09-20 MED ORDER — LISINOPRIL-HYDROCHLOROTHIAZIDE 20-25 MG PO TABS: 1.0000 | ORAL_TABLET | Freq: Every day | ORAL | Status: DC

## 2011-10-28 ENCOUNTER — Other Ambulatory Visit: Payer: Self-pay | Admitting: Internal Medicine

## 2011-11-01 ENCOUNTER — Other Ambulatory Visit: Payer: Self-pay | Admitting: Gastroenterology

## 2011-11-01 MED ORDER — PANTOPRAZOLE SODIUM 40 MG PO TBEC: DELAYED_RELEASE_TABLET | ORAL | Status: DC

## 2011-11-01 NOTE — Telephone Encounter (Signed)
rx sent, appt already made

## 2011-11-10 ENCOUNTER — Encounter: Payer: Self-pay | Admitting: *Deleted

## 2011-11-16 ENCOUNTER — Encounter: Payer: Self-pay | Admitting: Gastroenterology

## 2011-11-16 ENCOUNTER — Ambulatory Visit (INDEPENDENT_AMBULATORY_CARE_PROVIDER_SITE_OTHER): Payer: 59 | Admitting: Gastroenterology

## 2011-11-16 VITALS — BP 118/64 | HR 80 | Ht 70.0 in | Wt 205.0 lb

## 2011-11-16 DIAGNOSIS — K219 Gastro-esophageal reflux disease without esophagitis: Secondary | ICD-10-CM

## 2011-11-16 MED ORDER — PANTOPRAZOLE SODIUM 40 MG PO TBEC: DELAYED_RELEASE_TABLET | ORAL | Status: DC

## 2011-11-16 NOTE — Progress Notes (Signed)
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 He is up-to-date on her endoscopies and colonoscopy exams. He denies any reflux symptoms, dysphagia, or hepatobiliary complaints. His mild essential hypertension is well controlled on medications. He has regular bowel movements and denies melena, hematochezia, or abdominal pain. Review of recent labs showed normal CBC and metabolic profile.   Current Medications, Allergies, Past Medical History, Past Surgical History, Family History and Social History were reviewed in [facility] record.  Pertinent Review of Systems Negative   Physical Exam: Healthy-appearing patient in no distress. Blood pressure 118/64, pulse 80 and regular, and weight 205 pounds with BMI of 29.41. Physical exam shows no organomegaly, masses or tenderness. The patient was in full gear making further exam otherwise impossible. Mental status was normal.    Assessment and Plan: GERD under good control on daily PPI therapy. I have reviewed her reflux regime with him and have renewed his Protonix 40 mg a day. We'll see him yearly or when necessary as needed. He is up-to-date on his endoscopy and colonoscopy exams. Please send a copy this to Dr. Cato Mulligan. No diagnosis found.

## 2011-11-16 NOTE — Progress Notes (Signed)
History of Present Illness: This is a     Current Medications, Allergies, Past Medical History, Past Surgical History, Family History and Social History were reviewed in [facility] record.   Assessment and plan: No diagnosis found.

## 2011-11-16 NOTE — Patient Instructions (Signed)
Your prescription(s) have been sent to you pharmacy.   

## 2012-02-12 ENCOUNTER — Other Ambulatory Visit: Payer: Self-pay | Admitting: Internal Medicine

## 2012-07-26 ENCOUNTER — Other Ambulatory Visit (INDEPENDENT_AMBULATORY_CARE_PROVIDER_SITE_OTHER): Payer: 59

## 2012-07-26 DIAGNOSIS — Z0000 Encounter for general adult medical examination without abnormal findings: Secondary | ICD-10-CM

## 2012-07-26 LAB — POCT URINALYSIS DIPSTICK
Bilirubin, UA: NEGATIVE
Blood, UA: NEGATIVE
Glucose, UA: NEGATIVE
Ketones, UA: NEGATIVE
Leukocytes, UA: NEGATIVE
Nitrite, UA: NEGATIVE
Spec Grav, UA: 1.02
Urobilinogen, UA: 0.2
pH, UA: 6

## 2012-07-26 LAB — LIPID PANEL
Cholesterol: 187 mg/dL (ref 0–200)
HDL: 35.8 mg/dL — ABNORMAL LOW (ref >39.00)
Total CHOL/HDL Ratio: 5
Triglycerides: 391 mg/dL — ABNORMAL HIGH (ref 0.0–149.0)
VLDL: 78.2 mg/dL — ABNORMAL HIGH (ref 0.0–40.0)

## 2012-07-26 LAB — CBC WITH DIFFERENTIAL/PLATELET
Basophils Absolute: 0 10*3/uL (ref 0.0–0.1)
Basophils Relative: 0.6 % (ref 0.0–3.0)
Eosinophils Absolute: 0 10*3/uL (ref 0.0–0.7)
Eosinophils Relative: 1.1 % (ref 0.0–5.0)
HCT: 47.9 % (ref 39.0–52.0)
Hemoglobin: 16.4 g/dL (ref 13.0–17.0)
Lymphocytes Relative: 38.8 % (ref 12.0–46.0)
Lymphs Abs: 1.6 10*3/uL (ref 0.7–4.0)
MCHC: 34.2 g/dL (ref 30.0–36.0)
MCV: 91.5 fl (ref 78.0–100.0)
Monocytes Absolute: 0.3 10*3/uL (ref 0.1–1.0)
Monocytes Relative: 8.1 % (ref 3.0–12.0)
Neutro Abs: 2.1 10*3/uL (ref 1.4–7.7)
Neutrophils Relative %: 51.4 % (ref 43.0–77.0)
Platelets: 213 10*3/uL (ref 150.0–400.0)
RBC: 5.24 Mil/uL (ref 4.22–5.81)
RDW: 13.4 % (ref 11.5–14.6)
WBC: 4.1 10*3/uL — ABNORMAL LOW (ref 4.5–10.5)

## 2012-07-26 LAB — BASIC METABOLIC PANEL
BUN: 14 mg/dL (ref 6–23)
CO2: 30 mEq/L (ref 19–32)
Calcium: 9.4 mg/dL (ref 8.4–10.5)
Chloride: 102 mEq/L (ref 96–112)
Creatinine, Ser: 1.5 mg/dL (ref 0.4–1.5)
GFR: 62.91 mL/min (ref >60.00)
Glucose, Bld: 98 mg/dL (ref 70–99)
Potassium: 3.8 mEq/L (ref 3.5–5.1)
Sodium: 139 mEq/L (ref 135–145)

## 2012-07-26 LAB — LDL CHOLESTEROL, DIRECT: Direct LDL: 68.4 mg/dL

## 2012-07-26 LAB — HEPATIC FUNCTION PANEL
ALT: 24 U/L (ref 0–53)
AST: 24 U/L (ref 0–37)
Albumin: 4.1 g/dL (ref 3.5–5.2)
Alkaline Phosphatase: 58 U/L (ref 39–117)
Bilirubin, Direct: 0.1 mg/dL (ref 0.0–0.3)
Total Bilirubin: 1 mg/dL (ref 0.3–1.2)
Total Protein: 7 g/dL (ref 6.0–8.3)

## 2012-07-26 LAB — PSA: PSA: 0.77 ng/mL (ref 0.10–4.00)

## 2012-07-27 LAB — TSH: TSH: 0.65 u[IU]/mL (ref 0.35–5.50)

## 2012-08-02 ENCOUNTER — Encounter: Payer: 59 | Admitting: Internal Medicine

## 2012-08-02 ENCOUNTER — Telehealth: Payer: Self-pay | Admitting: Internal Medicine

## 2012-08-02 NOTE — Telephone Encounter (Signed)
Patient had to reschedule cpx due to inclement weather and would like a call with lab results. Please assist.

## 2012-08-18 ENCOUNTER — Encounter: Payer: 59 | Admitting: Internal Medicine

## 2012-08-28 ENCOUNTER — Other Ambulatory Visit: Payer: Self-pay | Admitting: *Deleted

## 2012-08-28 MED ORDER — PANTOPRAZOLE SODIUM 40 MG PO TBEC: DELAYED_RELEASE_TABLET | ORAL | Status: DC

## 2012-08-29 ENCOUNTER — Other Ambulatory Visit: Payer: Self-pay | Admitting: *Deleted

## 2012-08-29 DIAGNOSIS — I10 Essential (primary) hypertension: Secondary | ICD-10-CM

## 2012-08-29 MED ORDER — LISINOPRIL-HYDROCHLOROTHIAZIDE 20-25 MG PO TABS: 1.0000 | ORAL_TABLET | Freq: Every day | ORAL | Status: DC

## 2012-10-11 ENCOUNTER — Encounter: Payer: 59 | Admitting: Internal Medicine

## 2012-10-30 ENCOUNTER — Ambulatory Visit (INDEPENDENT_AMBULATORY_CARE_PROVIDER_SITE_OTHER): Payer: 59 | Admitting: Internal Medicine

## 2012-10-30 ENCOUNTER — Encounter: Payer: Self-pay | Admitting: Internal Medicine

## 2012-10-30 VITALS — BP 132/90 | HR 76 | Temp 98.6°F | Ht 70.0 in | Wt 197.0 lb

## 2012-10-30 DIAGNOSIS — Z0000 Encounter for general adult medical examination without abnormal findings: Secondary | ICD-10-CM

## 2012-10-30 DIAGNOSIS — E785 Hyperlipidemia, unspecified: Secondary | ICD-10-CM

## 2012-10-30 NOTE — Progress Notes (Signed)
Patient ID: Cody Simmons, male   DOB: 06/20/1961, 52 y.o.   MRN: [id number] cpx  Past Medical History  Diagnosis Date  . Allergy   . GERD (gastroesophageal reflux disease)   . Hypertension   . Retinal necrosis, acute, left   . Diverticulosis of colon (without mention of hemorrhage)   . Hiatal hernia     History   Social History  . Marital Status: Divorced    Spouse Name: N/A    Number of Children: N/A  . Years of Education: N/A   Occupational History  . Police Officer [employer]   Social History Main Topics  . Smoking status: Never Smoker   . Smokeless tobacco: Never Used  . Alcohol Use: No  . Drug Use: No  . Sexually Active: Not on file   Other Topics Concern  . Not on file   Social History Narrative  . No narrative on file    Past Surgical History  Procedure Laterality Date  . Cataract extraction      Family History  Problem Relation Age of Onset  . Hypertension Mother   . Kidney cancer Mother   . Hypertension Father     Allergies  Allergen Reactions  . Shellfish Allergy     Current Outpatient Prescriptions on File Prior to Visit  Medication Sig Dispense Refill  . CIALIS 20 MG tablet TAKE ONE-HALF (1/2) TO 1 TABLET ONCE A DAY AS NEEDED  18 tablet  5  . ketorolac (ACULAR) 0.5 % ophthalmic solution Place 1 drop into the left eye 2 (two) times daily.      Marland Kitchen lisinopril-hydrochlorothiazide (PRINZIDE,ZESTORETIC) 20-25 MG per tablet Take 1 tablet by mouth daily.  90 tablet  0  . loteprednol (LOTEMAX) 0.5 % ophthalmic suspension Place 1 drop into the left eye 4 (four) times daily.      . pantoprazole (PROTONIX) 40 MG tablet Take one tablet by mouth once a day  90 tablet  3  . PARoxetine (PAXIL) 10 MG tablet TAKE 1 TABLET EVERY OTHER NIGHT. (NEED OFFICE VISIT FOR ADDITIONAL REFILLS)  90 tablet  1   No current facility-administered medications on file prior to visit.     patient denies chest pain, shortness of breath, orthopnea. Denies lower  extremity edema, abdominal pain, change in appetite, change in bowel movements. Patient denies rashes, musculoskeletal complaints. No other specific complaints in a complete review of systems.   BP 132/90  Pulse 76  Temp(Src) 98.6 F (37 C) (Oral)  Ht 5\' 10"  (1.778 m)  Wt 197 lb (89.359 kg)  BMI 28.27 kg/m2   well-developed well-nourished male in no acute distress. HEENT exam atraumatic, normocephalic, neck supple without jugular venous distention. Chest clear to auscultation cardiac exam S1-S2 are regular. Abdominal exam overweight with bowel sounds, soft and nontender. Extremities no edema. Neurologic exam is alert with a normal gait.  A/p well visit- health maint utd Note TG-- exercise- weight loss and follow a low fat diet, minimize simple CHO

## 2012-11-24 ENCOUNTER — Other Ambulatory Visit: Payer: Self-pay | Admitting: *Deleted

## 2012-11-24 DIAGNOSIS — I10 Essential (primary) hypertension: Secondary | ICD-10-CM

## 2012-11-24 MED ORDER — LISINOPRIL-HYDROCHLOROTHIAZIDE 20-25 MG PO TABS: 1.0000 | ORAL_TABLET | Freq: Every day | ORAL | Status: DC

## 2013-01-22 ENCOUNTER — Other Ambulatory Visit: Payer: Self-pay | Admitting: *Deleted

## 2013-01-22 MED ORDER — PAROXETINE HCL 10 MG PO TABS: ORAL_TABLET | ORAL | Status: DC

## 2013-01-22 MED ORDER — TADALAFIL 20 MG PO TABS: ORAL_TABLET | ORAL | Status: DC

## 2013-03-01 ENCOUNTER — Other Ambulatory Visit (INDEPENDENT_AMBULATORY_CARE_PROVIDER_SITE_OTHER): Payer: 59

## 2013-03-01 DIAGNOSIS — E785 Hyperlipidemia, unspecified: Secondary | ICD-10-CM

## 2013-03-01 LAB — LIPID PANEL
Cholesterol: 196 mg/dL (ref 0–200)
HDL: 39.2 mg/dL (ref >39.00)
LDL Cholesterol: 127 mg/dL — ABNORMAL HIGH (ref 0–99)
Total CHOL/HDL Ratio: 5
Triglycerides: 150 mg/dL — ABNORMAL HIGH (ref 0.0–149.0)
VLDL: 30 mg/dL (ref 0.0–40.0)

## 2013-05-05 ENCOUNTER — Other Ambulatory Visit: Payer: Self-pay | Admitting: Internal Medicine

## 2013-09-09 ENCOUNTER — Other Ambulatory Visit: Payer: Self-pay | Admitting: Internal Medicine

## 2013-09-26 ENCOUNTER — Other Ambulatory Visit: Payer: Self-pay | Admitting: Internal Medicine

## 2013-09-26 ENCOUNTER — Other Ambulatory Visit: Payer: Self-pay | Admitting: Gastroenterology

## 2013-10-26 ENCOUNTER — Ambulatory Visit (INDEPENDENT_AMBULATORY_CARE_PROVIDER_SITE_OTHER): Payer: 59 | Admitting: Family Medicine

## 2013-10-26 ENCOUNTER — Encounter: Payer: Self-pay | Admitting: Family Medicine

## 2013-10-26 VITALS — BP 110/80 | HR 83 | Temp 98.8°F | Ht 70.0 in | Wt 206.0 lb

## 2013-10-26 DIAGNOSIS — M549 Dorsalgia, unspecified: Secondary | ICD-10-CM

## 2013-10-26 MED ORDER — NAPROXEN 500 MG PO TABS: 500.0000 mg | ORAL_TABLET | Freq: Two times a day (BID) | ORAL | Status: DC

## 2013-10-26 NOTE — Progress Notes (Signed)
No chief complaint on file.   HPI:  Low back pain: -low back pain on and off for 20 years -flares 2-3 times per year, has seen specialist in the past and tx with prednisone -this flare bilat low back pain worse with standing and bending, hamstrings tight too, pain level is 6/10, sharp -better with sitting and stretching -normally takes OTC meds for this, this spell lasting a little longer -usually triggered by standing a certain way -denies: weakness, numbness, fevers, malaise, bowel or bladder incontinence  ROS: See pertinent positives and negatives per HPI.  Past Medical History  Diagnosis Date  . Allergy   . GERD (gastroesophageal reflux disease)   . Hypertension   . Retinal necrosis, acute, left   . Diverticulosis of colon (without mention of hemorrhage)   . Hiatal hernia     Past Surgical History  Procedure Laterality Date  . Cataract extraction      Family History  Problem Relation Age of Onset  . Hypertension Mother   . Kidney cancer Mother   . Hypertension Father     History   Social History  . Marital Status: Divorced    Spouse Name: N/A    Number of Children: N/A  . Years of Education: N/A   Occupational History  . 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 Smoking status: Never Smoker   . Smokeless tobacco: Never Used  . Alcohol Use: No  . Drug Use: No  . Sexual Activity: None   Other Topics Concern  . None   Social History Narrative  . None    Current outpatient prescriptions:cetirizine (ZYRTEC) 10 MG tablet, Take 10 mg by mouth daily., Disp: , Rfl: ;  fluticasone (FLONASE) 50 MCG/ACT nasal spray, Place 1 spray into the nose daily., Disp: , Rfl: ;  ketorolac (ACULAR) 0.5 % ophthalmic solution, Place 1 drop into the left eye 2 (two) times daily., Disp: , Rfl: ;  lisinopril-hydrochlorothiazide (PRINZIDE,ZESTORETIC) 20-25 MG per tablet, Take 1 tablet by mouth  daily, Disp: 90 tablet, Rfl: 1 loteprednol (LOTEMAX) 0.5 % ophthalmic  suspension, Place 1 drop into the left eye 4 (four) times daily., Disp: , Rfl: ;  pantoprazole (PROTONIX) 40 MG tablet, Take one tablet by mouth once a day, Disp: 90 tablet, Rfl: 3;  PARoxetine (PAXIL) 10 MG tablet, Take 1 tablet by mouth  every other night, Disp: 45 tablet, Rfl: 0;  tadalafil (CIALIS) 20 MG tablet, TAKE ONE-HALF (1/2) TO 1 TABLET ONCE A DAY AS NEEDED, Disp: 18 tablet, Rfl: 1 naproxen (NAPROSYN) 500 MG tablet, Take 1 tablet (500 mg total) by mouth 2 (two) times daily with a meal., Disp: 30 tablet, Rfl: 0  EXAM:  Filed Vitals:   10/26/13 1604  BP: 110/80  Pulse: 83  Temp: 98.8 F (37.1 C)    Body mass index is 29.56 kg/(m^2).  GENERAL: vitals reviewed and listed above, alert, oriented, appears well hydrated and in no acute distress  HEENT: atraumatic, conjunttiva clear, no obvious abnormalities on inspection of external nose and ears  NECK: no obvious masses on inspection  LUNGS: clear to auscultation bilaterally, no wheezes, rales or rhonchi, good air movement  CV: HRRR, no peripheral edema  MS: moves all extremities without noticeable abnormality Normal Gait Normal inspection of back, no obvious scoliosis or leg length descrepancy No bony TTP Soft tissue TTP at: bilat lumbar paraspinal muscles and R PSIS -/+ tests: neg trendelenburg,+facet loading R, -SLRT, -CLRT, -FABER, -FADIR Normal muscle strength, sensation  to light touch and DTRs in LEs bilaterally  PSYCH: pleasant and cooperative, no obvious depression or anxiety  ASSESSMENT AND PLAN:  Discussed the following assessment and plan:  Back pain - Plan: Ambulatory referral to Orthopedic Surgery, naproxen (NAPROSYN) 500 MG tablet  -query DDD, spinal stenosis and possible R sacroiliitis, no sig neuro deficits on exam or alarm features -we discussed possible serious and likely etiologies, workup and treatment, treatment risks and return precautions -after this discussion, Kemauri opted for HEP, supportive  care and requested referral to [place] [facility] as feels symptoms have progressively worsened over time and wants to see specialist -follow up advised as needed -of course, we advised Robson  to return or notify a doctor immediately if symptoms worsen or persist or new concerns arise.  NOTE: wants to try naproxen but may call back for short course of prn opioid for pain if this is not working  -Patient advised to return or notify a doctor immediately if symptoms worsen or persist or new concerns arise.  Patient Instructions  -We placed a referral for you as discussed. It usually takes about 1-2 weeks to process and schedule this referral. If you have not heard from us regarding this appointment in 2 weeks please contact our office.  -do the exercises provided  -heat for 15 minute twice daily  -topical sports creams can be helpful for some patients  -us the naproxen as needed         Terressa KoyanagiHannah R. Brainard Highfill

## 2013-10-26 NOTE — Patient Instructions (Signed)
-  We placed a referral for you as discussed. It usually takes about 1-2 weeks to process and schedule this referral. If you have not heard from us regarding this appointment in 2 weeks please contact our office.  -do the exercises provided  -heat for 15 minute twice daily  -topical sports creams can be helpful for some patients  -us the naproxen as needed

## 2013-10-26 NOTE — Progress Notes (Signed)
Pre visit review using our clinic review tool, if applicable. No additional management support is needed unless otherwise documented below in the visit note. 

## 2013-11-12 ENCOUNTER — Other Ambulatory Visit: Payer: Self-pay | Admitting: Internal Medicine

## 2013-12-03 ENCOUNTER — Other Ambulatory Visit: Payer: Self-pay | Admitting: Gastroenterology

## 2013-12-04 ENCOUNTER — Other Ambulatory Visit: Payer: Self-pay | Admitting: Gastroenterology

## 2013-12-11 ENCOUNTER — Other Ambulatory Visit: Payer: Self-pay | Admitting: Gastroenterology

## 2013-12-11 ENCOUNTER — Telehealth: Payer: Self-pay | Admitting: Gastroenterology

## 2013-12-11 MED ORDER — PANTOPRAZOLE SODIUM 40 MG PO TBEC: DELAYED_RELEASE_TABLET | ORAL | Status: DC

## 2013-12-11 NOTE — Telephone Encounter (Signed)
OK 

## 2013-12-11 NOTE — Telephone Encounter (Signed)
Prescription sent to patient's pharmacy.

## 2013-12-11 NOTE — Telephone Encounter (Signed)
Dr. Russella Dar,  Patient has a follow up visit with you on 12-24-2013. Patient is requesting refill of Pantoprazole, is it okay to send?

## 2013-12-24 ENCOUNTER — Ambulatory Visit (INDEPENDENT_AMBULATORY_CARE_PROVIDER_SITE_OTHER): Payer: 59 | Admitting: Gastroenterology

## 2013-12-24 ENCOUNTER — Encounter: Payer: Self-pay | Admitting: Gastroenterology

## 2013-12-24 VITALS — BP 100/70 | HR 80 | Ht 69.0 in | Wt 197.2 lb

## 2013-12-24 DIAGNOSIS — Z1211 Encounter for screening for malignant neoplasm of colon: Secondary | ICD-10-CM

## 2013-12-24 DIAGNOSIS — K219 Gastro-esophageal reflux disease without esophagitis: Secondary | ICD-10-CM

## 2013-12-24 MED ORDER — PANTOPRAZOLE SODIUM 40 MG PO TBEC: DELAYED_RELEASE_TABLET | ORAL | Status: DC

## 2013-12-24 NOTE — Progress Notes (Signed)
    History of Present Illness: This is a 53 year old male with chronic GERD previously followed by Dr. Jarold MottoPatterson. His reflux symptoms are under excellent control on daily pantoprazole. He has no other gastrointestinal complaints. Denies weight loss, abdominal pain, constipation, diarrhea, change in stool caliber, melena, hematochezia, nausea, vomiting, dysphagia, reflux symptoms, chest pain.  Current Medications, Allergies, Past Medical History, Past Surgical History, Family History and Social History were reviewed in [facility] electronic medical record.  Physical Exam: General: Well developed , well nourished, no acute distress Head: Normocephalic and atraumatic Eyes:  sclerae anicteric, EOMI Ears: Normal auditory acuity Mouth: No deformity or lesions Lungs: Clear throughout to auscultation Heart: Regular rate and rhythm; no murmurs, rubs or bruits Abdomen: Soft, non tender and non distended. No masses, hepatosplenomegaly or hernias noted. Normal Bowel sounds Musculoskeletal: Symmetrical with no gross deformities  Pulses:  Normal pulses noted Extremities: No clubbing, cyanosis, edema or deformities noted Neurological: Alert oriented x 4, grossly nonfocal Psychological:  Alert and cooperative. Normal mood and affect  Assessment and Recommendations:  1. GERD. Continue standard antireflux measures and pantoprazole 40 mg daily.  2. Colorectal cancer screening, average risk. Routine 10 year interval, screening colonoscopy recommended August 2018.

## 2013-12-24 NOTE — Patient Instructions (Signed)
We have sent the following medications to your pharmacy for you to pick up at your convenience: pantoprazole   You are due for a recall colonoscopy in 2018 we will send you a letter in the mail to remind you.

## 2014-01-05 ENCOUNTER — Other Ambulatory Visit: Payer: Self-pay | Admitting: Internal Medicine

## 2014-01-07 ENCOUNTER — Other Ambulatory Visit (INDEPENDENT_AMBULATORY_CARE_PROVIDER_SITE_OTHER): Payer: 59

## 2014-01-07 DIAGNOSIS — Z0000 Encounter for general adult medical examination without abnormal findings: Secondary | ICD-10-CM

## 2014-01-07 LAB — BASIC METABOLIC PANEL
BUN: 18 mg/dL (ref 6–23)
CO2: 27 mEq/L (ref 19–32)
Calcium: 9.7 mg/dL (ref 8.4–10.5)
Chloride: 103 mEq/L (ref 96–112)
Creatinine, Ser: 1.5 mg/dL (ref 0.4–1.5)
GFR: 64.55 mL/min (ref >60.00)
Glucose, Bld: 109 mg/dL — ABNORMAL HIGH (ref 70–99)
Potassium: 3.3 mEq/L — ABNORMAL LOW (ref 3.5–5.1)
Sodium: 138 mEq/L (ref 135–145)

## 2014-01-07 LAB — CBC WITH DIFFERENTIAL/PLATELET
Basophils Absolute: 0 10*3/uL (ref 0.0–0.1)
Basophils Relative: 0.9 % (ref 0.0–3.0)
Eosinophils Absolute: 0.1 10*3/uL (ref 0.0–0.7)
Eosinophils Relative: 1.2 % (ref 0.0–5.0)
HCT: 44.5 % (ref 39.0–52.0)
Hemoglobin: 15.2 g/dL (ref 13.0–17.0)
Lymphocytes Relative: 55 % — ABNORMAL HIGH (ref 12.0–46.0)
Lymphs Abs: 2.6 10*3/uL (ref 0.7–4.0)
MCHC: 34.1 g/dL (ref 30.0–36.0)
MCV: 92.3 fl (ref 78.0–100.0)
Monocytes Absolute: 0.3 10*3/uL (ref 0.1–1.0)
Monocytes Relative: 6.6 % (ref 3.0–12.0)
Neutro Abs: 1.7 10*3/uL (ref 1.4–7.7)
Neutrophils Relative %: 36.3 % — ABNORMAL LOW (ref 43.0–77.0)
Platelets: 235 10*3/uL (ref 150.0–400.0)
RBC: 4.82 Mil/uL (ref 4.22–5.81)
RDW: 13.7 % (ref 11.5–15.5)
WBC: 4.7 10*3/uL (ref 4.0–10.5)

## 2014-01-07 LAB — LIPID PANEL
Cholesterol: 189 mg/dL (ref 0–200)
HDL: 41.4 mg/dL (ref >39.00)
LDL Cholesterol: 101 mg/dL — ABNORMAL HIGH (ref 0–99)
NonHDL: 147.6
Total CHOL/HDL Ratio: 5
Triglycerides: 234 mg/dL — ABNORMAL HIGH (ref 0.0–149.0)
VLDL: 46.8 mg/dL — ABNORMAL HIGH (ref 0.0–40.0)

## 2014-01-07 LAB — POCT URINALYSIS DIPSTICK
Bilirubin, UA: NEGATIVE
Blood, UA: NEGATIVE
Glucose, UA: NEGATIVE
Ketones, UA: NEGATIVE
Leukocytes, UA: NEGATIVE
Nitrite, UA: NEGATIVE
Protein, UA: NEGATIVE
Spec Grav, UA: 1.02
Urobilinogen, UA: 0.2
pH, UA: 5.5

## 2014-01-07 LAB — HEPATIC FUNCTION PANEL
ALT: 23 U/L (ref 0–53)
AST: 26 U/L (ref 0–37)
Albumin: 4.1 g/dL (ref 3.5–5.2)
Alkaline Phosphatase: 49 U/L (ref 39–117)
Bilirubin, Direct: 0.1 mg/dL (ref 0.0–0.3)
Total Bilirubin: 0.9 mg/dL (ref 0.2–1.2)
Total Protein: 6.8 g/dL (ref 6.0–8.3)

## 2014-01-07 LAB — PSA: PSA: 0.77 ng/mL (ref 0.10–4.00)

## 2014-01-07 LAB — TSH: TSH: 1.03 u[IU]/mL (ref 0.35–4.50)

## 2014-01-14 ENCOUNTER — Ambulatory Visit (INDEPENDENT_AMBULATORY_CARE_PROVIDER_SITE_OTHER): Payer: 59 | Admitting: Internal Medicine

## 2014-01-14 ENCOUNTER — Encounter: Payer: Self-pay | Admitting: Internal Medicine

## 2014-01-14 VITALS — BP 102/72 | HR 92 | Temp 98.7°F | Ht 70.0 in | Wt 196.0 lb

## 2014-01-14 DIAGNOSIS — Z0000 Encounter for general adult medical examination without abnormal findings: Secondary | ICD-10-CM

## 2014-01-14 MED ORDER — LISINOPRIL 20 MG PO TABS: 20.0000 mg | ORAL_TABLET | Freq: Every day | ORAL | Status: DC

## 2014-01-14 NOTE — Progress Notes (Signed)
Pre visit review using our clinic review tool, if applicable. No additional management support is needed unless otherwise documented below in the visit note. 

## 2014-01-14 NOTE — Progress Notes (Signed)
cpx  Past Medical History  Diagnosis Date  . Allergy   . GERD (gastroesophageal reflux disease)   . Hypertension   . Retinal necrosis, acute, left   . Diverticulosis of colon (without mention of hemorrhage)   . Hiatal hernia     History   Social History  . Marital Status: Divorced    Spouse Name: N/A    Number of Children: N/A  . Years of Education: N/A   Occupational History  . 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 Smoking status: Never Smoker   . Smokeless tobacco: Never Used  . Alcohol Use: No  . Drug Use: No  . Sexual Activity: Not on file   Other Topics Concern  . Not on file   Social History Narrative  . No narrative on file    Past Surgical History  Procedure Laterality Date  . Cataract extraction      Family History  Problem Relation Age of Onset  . Hypertension Mother   . Kidney cancer Mother   . Hypertension Father     Allergies  Allergen Reactions  . Shellfish Allergy     Current Outpatient Prescriptions on File Prior to Visit  Medication Sig Dispense Refill  . cetirizine (ZYRTEC) 10 MG tablet Take 10 mg by mouth daily.      . fluticasone (FLONASE) 50 MCG/ACT nasal spray Place 1 spray into the nose daily.      Marland Kitchen. ketorolac (ACULAR) 0.5 % ophthalmic solution Place 1 drop into the left eye 2 (two) times daily.      Marland Kitchen. lisinopril-hydrochlorothiazide (PRINZIDE,ZESTORETIC) 20-25 MG per tablet Take 1 tablet by mouth  daily  90 tablet  1  . loteprednol (LOTEMAX) 0.5 % ophthalmic suspension Place 1 drop into the left eye 4 (four) times daily.      . pantoprazole (PROTONIX) 40 MG tablet Take one tablet by mouth once a day  30 tablet  11  . PARoxetine (PAXIL) 10 MG tablet Take 1 tablet by mouth  every other night  45 tablet  0  . tadalafil (CIALIS) 20 MG tablet TAKE ONE-HALF (1/2) TO 1 TABLET ONCE A DAY AS NEEDED  18 tablet  1   No current facility-administered medications on file prior to visit.     patient denies chest pain,  shortness of breath, orthopnea. Denies lower extremity edema, abdominal pain, change in appetite, change in bowel movements. Patient denies rashes, musculoskeletal complaints. No other specific complaints in a complete review of systems.   BP 102/72  Pulse 92  Temp(Src) 98.7 F (37.1 C) (Oral)  Ht 5\' 10"  (1.778 m)  Wt 196 lb (88.905 kg)  BMI 28.12 kg/m2  well-developed well-nourished male in no acute distress. HEENT exam atraumatic, normocephalic, neck supple without jugular venous distention. Chest clear to auscultation cardiac exam S1-S2 are regular. Abdominal exam overweight with bowel sounds, soft and nontender. Extremities no edema. Neurologic exam is alert with a normal gait.  Well visit-  Health maint utd Note hypokalemia. Will change lisinopril/hct to just lisinopril (suspect hypokalemai will resolve)

## 2014-02-07 ENCOUNTER — Other Ambulatory Visit: Payer: Self-pay | Admitting: Internal Medicine

## 2014-05-09 ENCOUNTER — Telehealth: Payer: Self-pay | Admitting: Internal Medicine

## 2014-05-09 MED ORDER — PAROXETINE HCL 10 MG PO TABS: ORAL_TABLET | ORAL | Status: DC

## 2014-05-09 NOTE — Telephone Encounter (Signed)
[facility] - [place], [place] - [address] is requesting re-fill on PARoxetine (PAXIL) 10 MG tablet

## 2014-05-09 NOTE — Telephone Encounter (Signed)
rx sent in electronically 

## 2014-11-06 ENCOUNTER — Other Ambulatory Visit: Payer: Self-pay | Admitting: Internal Medicine

## 2014-11-25 ENCOUNTER — Other Ambulatory Visit: Payer: Self-pay | Admitting: Gastroenterology

## 2014-12-24 ENCOUNTER — Ambulatory Visit (INDEPENDENT_AMBULATORY_CARE_PROVIDER_SITE_OTHER): Payer: 59 | Admitting: Family Medicine

## 2014-12-24 ENCOUNTER — Encounter: Payer: Self-pay | Admitting: Family Medicine

## 2014-12-24 VITALS — BP 102/80 | HR 93 | Temp 98.5°F | Ht 70.0 in | Wt 199.0 lb

## 2014-12-24 DIAGNOSIS — I10 Essential (primary) hypertension: Secondary | ICD-10-CM

## 2014-12-24 DIAGNOSIS — E782 Mixed hyperlipidemia: Secondary | ICD-10-CM

## 2014-12-24 DIAGNOSIS — K219 Gastro-esophageal reflux disease without esophagitis: Secondary | ICD-10-CM | POA: Insufficient documentation

## 2014-12-24 DIAGNOSIS — F5221 Male erectile disorder: Secondary | ICD-10-CM

## 2014-12-24 DIAGNOSIS — J309 Allergic rhinitis, unspecified: Secondary | ICD-10-CM

## 2014-12-24 NOTE — Progress Notes (Signed)
HPI:  Cody Simmons is here to establish care.   Has the following chronic problems that require follow up and concerns today:  Hx DDD with Spinal Stenosis: -saw [place] [facility] and relieved with epidural inj in 2015 -doing well now -denies weakness, numbness  Seasonal allergist/shellfish allergy: -chronic -goes to allergist and gets allergy shots -meds: flonase and zyrtec  HTN/mild HLD: -dx a long time ago -on lisinopril  daily -denies: CP, SOB, swelling -no regular exercise, healthy diet  Hx of sexual anxiety: -on paxil  every other day for this -uses cialis rarely -denies: depression, SI, panic attacks  GERD: -hx of hiatal hernia -on PPI   -sees Dr. Russella Dar -denies hx stricture, dysphagia  Hx of Acute retinal necrosis: -sees Dr. Luciana Axe  ROS negative for unless reported above: fevers, unintentional weight loss, hearing or vision loss, chest pain, palpitations, struggling to breath, hemoptysis, melena, hematochezia, hematuria, falls, loc, si, thoughts of self harm  Past Medical History  Diagnosis Date  . Allergy   . GERD (gastroesophageal reflux disease)   . Hypertension   . Retinal necrosis, acute, left   . Diverticulosis of colon (without mention of hemorrhage)   . Hiatal hernia   . Spinal stenosis     saw [facility] [facility] in 2015 s/p epidural inj    Past Surgical History  Procedure Laterality Date  . Cataract extraction      Family History  Problem Relation Age of Onset  . Hypertension Mother   . Kidney cancer Mother   . Hypertension Father   . Hyperlipidemia Father     History   Social History  . Marital Status: Divorced    Spouse Name: N/A  . Number of Children: N/A  . Years of Education: N/A   Occupational History  . Emergency planning/management officer Unemployed   Social History Main Topics  . Smoking status: Never Smoker   . Smokeless tobacco: Never Used  . Alcohol Use: No  . Drug Use: No  . Sexual Activity: Not on file   Other Topics Concern   . None   Social History Narrative   Work or School: retired, Dispensing optician Situation: lives with wife      Spiritual Beliefs: Baptist      Lifestyle: no regular; diet is healthy           Current outpatient prescriptions:  .  cetirizine (ZYRTEC) 10 MG tablet, Take 10 mg by mouth daily., Disp: , Rfl:  .  CIALIS 20 MG tablet, Take 1/2 to 1 tablet by  mouth daily as needed, Disp: 15 tablet, Rfl: 11 .  fluticasone (FLONASE) 50 MCG/ACT nasal spray, Place 1 spray into the nose daily., Disp: , Rfl:  .  ketorolac (ACULAR) 0.5 % ophthalmic solution, Place 1 drop into the left eye 2 (two) times daily., Disp: , Rfl:  .  lisinopril (PRINIVIL,ZESTRIL) 20 MG tablet, Take 1 tablet (20 mg total) by mouth daily., Disp: 90 tablet, Rfl: 3 .  loteprednol (LOTEMAX) 0.5 % ophthalmic suspension, Place 1 drop into the left eye 4 (four) times daily., Disp: , Rfl:  .  pantoprazole (PROTONIX) 40 MG tablet, Take 1 tablet by mouth once a day, Disp: 90 tablet, Rfl: 0 .  PARoxetine (PAXIL) 10 MG tablet, Take 1 tablet by mouth  every other night, Disp: 45 tablet, Rfl: 2  EXAM:  Filed Vitals:   12/24/14 0941  BP: 102/80  Pulse: 93  Temp: 98.5 F (36.9 C)  Body mass index is 28.55 kg/(m^2).  GENERAL: vitals reviewed and listed above, alert, oriented, appears well hydrated and in no acute distress  HEENT: atraumatic, conjunttiva clear, no obvious abnormalities on inspection of external nose and ears  NECK: no obvious masses on inspection  LUNGS: clear to auscultation bilaterally, no wheezes, rales or rhonchi, good air movement  CV: HRRR, no peripheral edema  MS: moves all extremities without noticeable abnormality  PSYCH: pleasant and cooperative, no obvious depression or anxiety  ASSESSMENT AND PLAN:  Discussed the following assessment and plan:  Gastroesophageal reflux disease, esophagitis presence not specified  Erectile disorder, acquired, situational, mild  Mixed  hyperlipidemia  Essential hypertension  Allergic rhinitis, unspecified allergic rhinitis type  -We reviewed the PMH, PSH, FH, SH, Meds and Allergies. -We provided refills for any medications we will prescribe as needed. -We addressed current concerns per orders and patient instructions. -We have asked for records for pertinent exams, studies, vaccines and notes from previous providers. -We have advised patient to follow up per instructions below.   -Patient advised to return or notify a doctor immediately if symptoms worsen or persist or new concerns arise.  Patient Instructions  BEFORE YOU LEAVE: -Physical in 3 months - come fasting, can have water and black coffee  We recommend the following healthy lifestyle measures: - eat a healthy diet consisting of lots of vegetables, fruits, beans, nuts, seeds, healthy meats such as white chicken and fish and whole grains.  - avoid starches, sweets, fried foods, fast food, processed foods, sodas, red meet and other fattening foods.  - get a least 150 minutes of aerobic exercise per week.       Kriste Basque R.

## 2014-12-24 NOTE — Progress Notes (Signed)
Pre visit review using our clinic review tool, if applicable. No additional management support is needed unless otherwise documented below in the visit note. 

## 2014-12-24 NOTE — Patient Instructions (Signed)
BEFORE YOU LEAVE: -Physical in 3 months - come fasting, can have water and black coffee  We recommend the following healthy lifestyle measures: - eat a healthy diet consisting of lots of vegetables, fruits, beans, nuts, seeds, healthy meats such as white chicken and fish and whole grains.  - avoid starches, sweets, fried foods, fast food, processed foods, sodas, red meet and other fattening foods.  - get a least 150 minutes of aerobic exercise per week.

## 2015-01-04 ENCOUNTER — Other Ambulatory Visit: Payer: Self-pay | Admitting: Internal Medicine

## 2015-01-08 ENCOUNTER — Other Ambulatory Visit: Payer: Self-pay | Admitting: Internal Medicine

## 2015-01-16 ENCOUNTER — Ambulatory Visit: Payer: Self-pay | Admitting: Family Medicine

## 2015-01-20 ENCOUNTER — Encounter: Payer: Self-pay | Admitting: Gastroenterology

## 2015-01-20 ENCOUNTER — Ambulatory Visit (INDEPENDENT_AMBULATORY_CARE_PROVIDER_SITE_OTHER): Payer: 59 | Admitting: Gastroenterology

## 2015-01-20 VITALS — BP 114/78 | HR 72 | Ht 70.0 in | Wt 197.4 lb

## 2015-01-20 DIAGNOSIS — K219 Gastro-esophageal reflux disease without esophagitis: Secondary | ICD-10-CM | POA: Diagnosis not present

## 2015-01-20 DIAGNOSIS — Z1211 Encounter for screening for malignant neoplasm of colon: Secondary | ICD-10-CM | POA: Diagnosis not present

## 2015-01-20 MED ORDER — PANTOPRAZOLE SODIUM 40 MG PO TBEC: 40.0000 mg | DELAYED_RELEASE_TABLET | Freq: Every day | ORAL | Status: DC

## 2015-01-20 NOTE — Progress Notes (Signed)
    History of Present Illness: This is a 54 year old male returning for follow-up of GERD. Symptoms are under excellent control and pantoprazole 40 mg daily. He has not tried discontinuing the medication several years. EGD in 2008 showed a hiatal hernia and no other abnormalities. He notes occasional episodes of loose stools associated with nuts and occasionally high fat foods. Denies weight loss, abdominal pain, constipation, change in stool caliber, melena, hematochezia, nausea, vomiting, dysphagia, reflux symptoms, chest pain.   Current Medications, Allergies, Past Medical History, Past Surgical History, Family History and Social History were reviewed in [facility] electronic medical record.  Physical Exam: General: Well developed , well nourished, no acute distress Head: Normocephalic and atraumatic Eyes:  sclerae anicteric, EOMI Ears: Normal auditory acuity Mouth: No deformity or lesions Lungs: Clear throughout to auscultation Heart: Regular rate and rhythm; no murmurs, rubs or bruits Abdomen: Soft, non tender and non distended. No masses, hepatosplenomegaly or hernias noted. Normal Bowel sounds Musculoskeletal: Symmetrical with no gross deformities  Pulses:  Normal pulses noted Extremities: No clubbing, cyanosis, edema or deformities noted Neurological: Alert oriented x 4, grossly nonfocal Psychological:  Alert and cooperative. Normal mood and affect  Assessment and Recommendations:  1. GERD. Continue standard antireflux measures and pantoprazole 40 mg daily. Trial of taking pantoprazole 40 mg every other day or as needed.  2. Colorectal cancer screening, average risk. Routine 10 year interval, screening colonoscopy recommended August 2018.  3. Infrequent loose stools related to certain dietary stressors.  15 minutes of face-to-face time was spent with the patient. Greater than 50% of the time was spent counseling and coordinating care.

## 2015-01-20 NOTE — Patient Instructions (Signed)
We have sent the following medications to your pharmacy for you to pick up at your convenience:Prononix.  You can try to take Protonix every other day or as needed.   Thank you for choosing me and [facility].  Venita LickMalcolm T. Pleas KochStark, Jr., MD., Clementeen GrahamFACG

## 2015-04-03 ENCOUNTER — Ambulatory Visit (INDEPENDENT_AMBULATORY_CARE_PROVIDER_SITE_OTHER): Payer: 59 | Admitting: Family Medicine

## 2015-04-03 ENCOUNTER — Encounter: Payer: Self-pay | Admitting: Family Medicine

## 2015-04-03 VITALS — BP 110/82 | HR 70 | Temp 98.0°F | Ht 70.0 in | Wt 197.5 lb

## 2015-04-03 DIAGNOSIS — K219 Gastro-esophageal reflux disease without esophagitis: Secondary | ICD-10-CM

## 2015-04-03 DIAGNOSIS — Z0000 Encounter for general adult medical examination without abnormal findings: Secondary | ICD-10-CM | POA: Diagnosis not present

## 2015-04-03 DIAGNOSIS — I10 Essential (primary) hypertension: Secondary | ICD-10-CM | POA: Diagnosis not present

## 2015-04-03 DIAGNOSIS — E782 Mixed hyperlipidemia: Secondary | ICD-10-CM

## 2015-04-03 LAB — BASIC METABOLIC PANEL
BUN: 12 mg/dL (ref 6–23)
CO2: 29 mEq/L (ref 19–32)
Calcium: 10 mg/dL (ref 8.4–10.5)
Chloride: 105 mEq/L (ref 96–112)
Creatinine, Ser: 1.43 mg/dL (ref 0.40–1.50)
GFR: 65.81 mL/min (ref >60.00)
Glucose, Bld: 100 mg/dL — ABNORMAL HIGH (ref 70–99)
Potassium: 4.1 mEq/L (ref 3.5–5.1)
Sodium: 142 mEq/L (ref 135–145)

## 2015-04-03 LAB — LIPID PANEL
Cholesterol: 206 mg/dL — ABNORMAL HIGH (ref 0–200)
HDL: 36.9 mg/dL — ABNORMAL LOW (ref >39.00)
NonHDL: 169.37
Total CHOL/HDL Ratio: 6
Triglycerides: 221 mg/dL — ABNORMAL HIGH (ref 0.0–149.0)
VLDL: 44.2 mg/dL — ABNORMAL HIGH (ref 0.0–40.0)

## 2015-04-03 LAB — HEMOGLOBIN A1C: Hgb A1c MFr Bld: 5.4 % (ref 4.6–6.5)

## 2015-04-03 LAB — LDL CHOLESTEROL, DIRECT: Direct LDL: 136 mg/dL

## 2015-04-03 NOTE — Patient Instructions (Signed)
BEFORE YOU LEAVE: -labs -schedule physical in 1 year  We recommend the following healthy lifestyle measures: - eat a healthy whole foods diet consisting of regular small meals composed of vegetables, fruits, beans, nuts, seeds, healthy meats such as white chicken and fish and whole grains.  - avoid sweets, white starchy foods, fried foods, fast food, processed foods, sodas, red meet and other fattening foods.  - get a least 150-300 minutes of aerobic exercise per week.   -We have ordered labs or studies at this visit. It can take up to 1-2 weeks for results and processing. We will contact you with instructions IF your results are abnormal. Normal results will be released to your [facility]. If you have not heard from [place] or can not find your results in [facility] in 2 weeks please contact our office.

## 2015-04-03 NOTE — Progress Notes (Signed)
Pre visit review using our clinic review tool, if applicable. No additional management support is needed unless otherwise documented below in the visit note. 

## 2015-04-03 NOTE — Progress Notes (Signed)
HPI:  Here for CPE:  -Concerns and/or follow up today:   Hx DDD with Spinal Stenosis: -saw [facility] [facility] and relieved with epidural inj in 2015 -doing well now -denies weakness, numbness  Seasonal allergies/shellfish allergy: -chronic -goes to allergist and gets allergy shots -meds: flonase and zyrtec  HTN/mild HLD: -dx a long time ago -on lisinopril  daily -denies: CP, SOB, swelling -no regular exercise, healthy diet but plans to start  Hx of sexual anxiety: -used to take cialis and paxil; no longer taking either and does not feel he needs these -denies: depression, SI, panic attacks  GERD: -hx of hiatal hernia -on PPI   -sees Dr. Russella Dar -denies hx stricture, dysphagia  Hx of Acute retinal necrosis: -sees Dr. Luciana Axe  -Diet: ok  -Exercise: no regular exercise  -Diabetes and Dyslipidemia Screening: FASTING for labs todya  -Vaccines: UTD, reports gets flu shot at work  -wants STI testing, Hep C screening (if born 48-1967): no  -FH colon or prstate ca: see FH Last colon cancer screening: done per his report Last prostate ca screening: 1 year ago, discussed risks benefits and he may consider q3 year screening with PSA  -Alcohol, Tobacco, drug use: see social history  Review of Systems - no fevers, unintentional weight loss, vision loss, hearing loss, chest pain, sob, hemoptysis, melena, hematochezia, hematuria, genital discharge, changing or concerning skin lesions, bleeding, bruising, loc, thoughts of self harm or SI  Past Medical History  Diagnosis Date  . Allergy   . GERD (gastroesophageal reflux disease)   . Hypertension   . Retinal necrosis, acute, left   . Diverticulosis of colon (without mention of hemorrhage)   . Hiatal hernia   . Spinal stenosis     saw [place] [facility] in 2015 s/p epidural inj    Past Surgical History  Procedure Laterality Date  . Cataract extraction      Family History  Problem Relation Age of Onset  . Hypertension  Mother   . Kidney cancer Mother   . Hypertension Father   . Hyperlipidemia Father     Social History   Social History  . Marital Status: Divorced    Spouse Name: N/A  . Number of Children: N/A  . Years of Education: N/A   Occupational History  . Emergency planning/management officer Unemployed   Social History Main Topics  . Smoking status: Never Smoker   . Smokeless tobacco: Never Used  . Alcohol Use: No  . Drug Use: No  . Sexual Activity: Not on file   Other Topics Concern  . 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  cetirizine (ZYRTEC) 10 MG tablet, Take 10 mg by mouth daily., Disp: , Rfl:  .  CIALIS 20 MG tablet, Take 1/2 to 1 tablet by  mouth daily as needed, Disp: 15 tablet, Rfl: 11 .  fluticasone (FLONASE) 50 MCG/ACT nasal spray, Place 1 spray into the nose daily., Disp: , Rfl:  .  ketorolac (ACULAR) 0.5 % ophthalmic solution, Place 1 drop into the left eye 2 (two) times daily., Disp: , Rfl:  .  lisinopril (PRINIVIL,ZESTRIL) 20 MG tablet, Take 1 tablet by mouth  daily, Disp: 90 tablet, Rfl: 3 .  loteprednol (  LOTEMAX) 0.5 % ophthalmic suspension, Place 1 drop into the left eye 4 (four) times daily., Disp: , Rfl:  .  pantoprazole (PROTONIX) 40 MG tablet, Take 1 tablet (40 mg total) by mouth daily., Disp: 90 tablet, Rfl: 3 .  PARoxetine (PAXIL) 10 MG tablet, Take 1 tablet by mouth  every other night, Disp: 45 tablet, Rfl: 0  EXAM:  There were no vitals filed for this visit.  Estimated body mass index is 28.32 kg/(m^2) as calculated from the following:   Height as of 01/20/15:  (1.778 m).   Weight as of 01/20/15: 197 lb 6.4 oz (89.54 kg).  GENERAL: vitals reviewed and listed below, alert, oriented, appears well hydrated and in no acute distress  HEENT: head atraumatic,  PERRLA, normal appearance of eyes, ears, nose and mouth. moist mucus membranes.  NECK: supple, no masses or lymphadenopathy  LUNGS: clear to auscultation bilaterally, no rales, rhonchi or wheeze  CV: HRRR, no peripheral edema or cyanosis, normal pedal pulses  ABDOMEN: bowel sounds normal, soft, non tender to palpation, no masses, no rebound or guarding  GU: declined  SKIN: no rash or abnormal lesions  MS: normal gait, moves all extremities normally  NEURO: CN II-XII grossly intact, normal muscle strength and sensation to light touch on extremities  PSYCH: normal affect, pleasant and cooperative  ASSESSMENT AND PLAN:  Discussed the following assessment and plan:  Visit for preventive health examination - Plan: Hemoglobin A1c  Essential hypertension - Plan: Basic metabolic panel  Gastroesophageal reflux disease, esophagitis presence not specified  Mixed hyperlipidemia - Plan: Lipid Panel   -Discussed and advised all [place] preventive services health task force level A and B recommendations for age, sex and risks.  -lifestyle recs - see pt instruction  -FASTING labs, studies and vaccines per orders this encounter   Patient advised to return to clinic immediately if symptoms worsen or persist or new concerns.  There are no Patient Instructions on file for this visit.  No Follow-up on file.   Cody Basque R.

## 2015-04-04 MED ORDER — PRAVASTATIN SODIUM 40 MG PO TABS: 40.0000 mg | ORAL_TABLET | Freq: Every day | ORAL | Status: DC

## 2015-04-04 NOTE — Addendum Note (Signed)
Addended by: Johnella Moloney on: 04/04/2015 01:25 PM   Modules accepted: Orders

## 2015-04-28 ENCOUNTER — Other Ambulatory Visit: Payer: Self-pay | Admitting: *Deleted

## 2015-04-28 ENCOUNTER — Encounter: Payer: Self-pay | Admitting: Family Medicine

## 2015-04-28 MED ORDER — PRAVASTATIN SODIUM 40 MG PO TABS: 40.0000 mg | ORAL_TABLET | Freq: Every day | ORAL | Status: DC

## 2015-04-28 NOTE — Telephone Encounter (Signed)
Rx done and the pt was informed via Mychart message. 

## 2015-12-31 ENCOUNTER — Encounter: Payer: Self-pay | Admitting: Family Medicine

## 2016-01-13 ENCOUNTER — Encounter: Payer: Self-pay | Admitting: Family Medicine

## 2016-01-13 ENCOUNTER — Ambulatory Visit (INDEPENDENT_AMBULATORY_CARE_PROVIDER_SITE_OTHER): Payer: 59 | Admitting: Family Medicine

## 2016-01-13 VITALS — BP 102/72 | HR 81 | Temp 98.8°F | Ht 70.0 in | Wt 192.9 lb

## 2016-01-13 DIAGNOSIS — G4733 Obstructive sleep apnea (adult) (pediatric): Secondary | ICD-10-CM

## 2016-01-13 DIAGNOSIS — R0683 Snoring: Secondary | ICD-10-CM

## 2016-01-13 DIAGNOSIS — G4710 Hypersomnia, unspecified: Secondary | ICD-10-CM | POA: Diagnosis not present

## 2016-01-13 DIAGNOSIS — I10 Essential (primary) hypertension: Secondary | ICD-10-CM

## 2016-01-13 DIAGNOSIS — E782 Mixed hyperlipidemia: Secondary | ICD-10-CM

## 2016-01-13 DIAGNOSIS — Z6827 Body mass index (BMI) 27.0-27.9, adult: Secondary | ICD-10-CM

## 2016-01-13 DIAGNOSIS — K219 Gastro-esophageal reflux disease without esophagitis: Secondary | ICD-10-CM

## 2016-01-13 DIAGNOSIS — R400 Somnolence: Secondary | ICD-10-CM

## 2016-01-13 NOTE — Progress Notes (Signed)
Pre visit review using our clinic review tool, if applicable. No additional management support is needed unless otherwise documented below in the visit note. 

## 2016-01-13 NOTE — Patient Instructions (Addendum)
BEFORE YOU LEAVE: -follow up:  1) Lab appointment in next 4 weeks for fasting labs 2) Physical exam in 6 months  -We placed a referral for you as discussed for evaluation for sleep apnea. It usually takes about 1-2 weeks to process and schedule this referral. If you have not heard from us regarding this appointment in 2 weeks please contact our office.   We recommend the following healthy lifestyle: 1) Small portions - eat off of salad plate instead of dinner plate 2) Eat a healthy clean diet with avoidance of (less then 1 serving per week) processed foods, sweetened drinks, white starches, red meat, fast foods and sweets and consisting of: * 5-9 servings per day of fresh or frozen fruits and vegetables (not corn or potatoes, not dried or canned) *nuts and seeds, beans *olives and olive oil *small portions of lean meats such as fish and white chicken  *small portions of whole grains 3)Get at least 150 minutes of sweaty aerobic exercise per week 4)reduce stress - counseling, meditation, relaxation to balance other aspects of your life  Continue your current medications

## 2016-01-13 NOTE — Progress Notes (Signed)
HPI:  ? Simmons: -reports chronic snoring, wife told him he stops breathing in sleep -some daytime somnolence and sometimes feels unrested when wakes up -want referral for eval  Seasonal allergies/shellfish allergy: -chronic -goes to allergist and gets allergy shots -meds: flonase and zyrtec  HTN/mild HLD: -dx a long time ago -on lisinopril 20mg  daily and now pravastatin - tolerating well -denies: CP, SOB, swelling -no regular exercise, reports he is trying to eat healthier  GERD: -hx of hiatal hernia -take protonic prn a few times per year -sees Dr. Russella DarStark -denies hx stricture, dysphagia  Hx of Acute retinal necrosis: -sees Dr. Luciana Axeankin  ROS: See pertinent positives and negatives per HPI.  Past Medical History  Diagnosis Date  . Allergy   . GERD (gastroesophageal reflux disease)   . Hypertension   . Retinal necrosis, acute, left   . Diverticulosis of colon (without mention of hemorrhage)   . Hiatal hernia   . Spinal stenosis     saw [facility] [facility] in 2015 s/p epidural inj    Past Surgical History  Procedure Laterality Date  . Cataract extraction      Family History  Problem Relation Age of Onset  . Hypertension Mother   . Kidney cancer Mother   . Hypertension Father   . Hyperlipidemia Father     Social History   Social History  . Marital Status: Divorced    Spouse Name: N/A  . Number of Children: N/A  . Years of Education: N/A   Occupational History  . 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 Smoking status: Never Smoker   . Smokeless tobacco: Never Used  . Alcohol Use: No  . Drug Use: No  . Sexual Activity: Not Asked   Other Topics Concern  . None   Social History Narrative   Work or School: retired, Dispensing opticianlaw enforcement      Home Situation: lives with wife      Spiritual Beliefs: Baptist      Lifestyle: no regular; diet is healthy           Current outpatient prescriptions:  .  fluticasone (FLONASE) 50 MCG/ACT nasal spray,  Place 1 spray into the nose daily., Disp: , Rfl:  .  ketorolac (ACULAR) 0.5 % ophthalmic solution, Place 1 drop into the left eye 2 (two) times daily., Disp: , Rfl:  .  lisinopril (PRINIVIL,ZESTRIL) 20 MG tablet, Take 1 tablet by mouth  daily, Disp: 90 tablet, Rfl: 3 .  loteprednol (LOTEMAX) 0.5 % ophthalmic suspension, Place 1 drop into the left eye 4 (four) times daily., Disp: , Rfl:  .  pravastatin (PRAVACHOL) 40 MG tablet, Take 1 tablet (40 mg total) by mouth daily., Disp: 90 tablet, Rfl: 3  EXAM:  Filed Vitals:   01/13/16 1433  BP: 102/72  Pulse: 81  Temp: 98.8 F (37.1 C)    Body mass index is 27.68 kg/(m^2).  GENERAL: vitals reviewed and listed above, alert, oriented, appears well hydrated and in no acute distress  HEENT: atraumatic, conjunttiva clear, no obvious abnormalities on inspection of external nose and ears  NECK: no obvious masses on inspection  LUNGS: clear to auscultation bilaterally, no wheezes, rales or rhonchi, good air movement  CV: HRRR, no peripheral edema  MS: moves all extremities without noticeable abnormality  PSYCH: pleasant and cooperative, no obvious depression or anxiety  ASSESSMENT AND PLAN:  Discussed the following assessment and plan:  Snoring - Plan: Ambulatory referral to Pulmonology  Daytime somnolence -  Plan: Ambulatory referral to Pulmonology  Possible Obstructive sleep apnea - Plan: Ambulatory referral to Pulmonology  Mixed hyperlipidemia - Plan: Lipid Panel  Essential hypertension - Plan: CBC with Differential/Platelets, Basic metabolic panel  Gastroesophageal reflux disease, esophagitis presence not specified  BMI 27.0-27.9,adult  -discussed testing and treatment options for evaluation for Simmons, referral to pulm for eval -lifestyle recs -cont current meds -time for labs - he declined to do today as insists on doing fasting - advised to schedule lab visit as he leaves  Patient Instructions  BEFORE YOU LEAVE: -follow  up:  1) Lab appointment in next 4 weeks for fasting labs 2) Physical exam in 6 months  -We placed a referral for you as discussed for evaluation for sleep apnea. It usually takes about 1-2 weeks to process and schedule this referral. If you have not heard from [place] regarding this appointment in 2 weeks please contact our office.   We recommend the following healthy lifestyle: 1) Small portions - eat off of salad plate instead of dinner plate 2) Eat a healthy clean diet with avoidance of (less then 1 serving per week) processed foods, sweetened drinks, white starches, red meat, fast foods and sweets and consisting of: * 5-9 servings per day of fresh or frozen fruits and vegetables (not corn or potatoes, not dried or canned) *nuts and seeds, beans *olives and olive oil *small portions of lean meats such as fish and white chicken  *small portions of whole grains 3)Get at least 150 minutes of sweaty aerobic exercise per week 4)reduce stress - counseling, meditation, relaxation to balance other aspects of your life  Continue your current medications     Kriste Basque R., DO

## 2016-01-14 ENCOUNTER — Encounter: Payer: Self-pay | Admitting: Pulmonary Disease

## 2016-01-14 ENCOUNTER — Ambulatory Visit (INDEPENDENT_AMBULATORY_CARE_PROVIDER_SITE_OTHER): Payer: 59 | Admitting: Pulmonary Disease

## 2016-01-14 VITALS — BP 100/64 | HR 77 | Ht 70.0 in | Wt 193.0 lb

## 2016-01-14 DIAGNOSIS — J309 Allergic rhinitis, unspecified: Secondary | ICD-10-CM | POA: Diagnosis not present

## 2016-01-14 DIAGNOSIS — G4710 Hypersomnia, unspecified: Secondary | ICD-10-CM | POA: Diagnosis not present

## 2016-01-14 DIAGNOSIS — G4733 Obstructive sleep apnea (adult) (pediatric): Secondary | ICD-10-CM | POA: Diagnosis not present

## 2016-01-14 NOTE — Assessment & Plan Note (Signed)
  Pt has snoring, witnessed apneas. Some gasping or choking.  Sleeps 7-8 hrs/night. Feels unrefreshed in am.  (-) morning headache.  Mild hypersomnia.  (-) abnormal behavior in sleep.  Wife is bothered by snoring/apneas  so patient is being seen here today.  ESS 9.   Plan:  We discussed about the diagnosis of Obstructive Sleep Apnea (OSA) and implications of untreated OSA. We discussed about CPAP and BiPaP as possible treatment options.   We will schedule the patient for a sleep study. Plan for a HST if insurance will cover. If not he is OK with lab study.  May opt not to Rx if only mild OSA as he is healthy. Need to call pt with results.  Pt has allergies/sinus issues which can get worse with cpap. May need to use flonase regularly. He stops flonase during summer.    Patient was instructed to call the office if he/she has not heard back from the office 1-2 weeks after the sleep study.   Patient was instructed to call the office if he/she is having issues with the PAP device.   We discussed good sleep hygiene.   Patient was advised not to engage in activities requiring concentration and/or vigilance if he/she is sleepy.  Patient was advised not to drive if he/she is sleepy.

## 2016-01-14 NOTE — Assessment & Plan Note (Signed)
Cont allergy shots q 2 weeks. Flonase prn. Cont meds.  Stable. May flare up with cpap.

## 2016-01-14 NOTE — Progress Notes (Signed)
Subjective:    Patient ID: Cody Simmons, male    DOB: 04/19/1961, 55 y.o.   MRN: [id number]  HPI   This is the case of Cody Simmons, 55 y.o. Male, who was referred by Dr. Kriste BasqueHannah Simmons in consultation regarding possibel OSA.   As you very well know, patient is a non smoker, not known to have asthma or copd.   Has has chronic allergic rhinitis/sinusitis for which he gets allergy shots q 2 weeks. Sees an allergy MD.  Pt has snoring, witnessed apneas. Some gasping or choking.  Sleeps 7-8 hrs/night. Feels unrefreshed in am.  (-) morning headache.  Mild hypersomnia.  (-) abnormal behavior in sleep.  Wife is bothered by snoring/apneas  so patient is being seen here today.   ESS 9.   Review of Systems  Constitutional: Negative for fever, chills, activity change, appetite change and unexpected weight change.  HENT: Negative for congestion, dental problem, postnasal drip, rhinorrhea, sneezing, sore throat, trouble swallowing and voice change.   Eyes: Negative for visual disturbance.  Respiratory: Negative for cough, choking and shortness of breath.   Cardiovascular: Negative for chest pain and leg swelling.  Gastrointestinal: Negative for nausea, vomiting and abdominal pain.  Genitourinary: Negative for difficulty urinating.  Musculoskeletal: Negative for arthralgias.  Skin: Negative for rash.  Psychiatric/Behavioral: Negative for behavioral problems and confusion.  All other systems reviewed and are negative.  Past Medical History  Diagnosis Date  . Allergy   . GERD (gastroesophageal reflux disease)   . Hypertension   . Retinal necrosis, acute, left   . Diverticulosis of colon (without mention of hemorrhage)   . Hiatal hernia   . Spinal stenosis     saw [place] [facility] in 2015 s/p epidural inj   (-) CA, DVT  Family History  Problem Relation Age of Onset  . Hypertension Mother   . Kidney cancer Mother   . Hypertension Father   . Hyperlipidemia Father      Past Surgical  History  Procedure Laterality Date  . Cataract extraction      Social History   Social History  . Marital Status: Divorced    Spouse Name: N/A  . Number of Children: N/A  . Years of Education: N/A   Occupational History  . 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 Smoking status: Never Smoker   . Smokeless tobacco: Never Used  . Alcohol Use: No  . Drug Use: No  . Sexual Activity: Not on file   Other Topics Concern  . Not on file   Social History Narrative   Work or School: retired, Dispensing opticianlaw enforcement      Home Situation: lives with wife      Spiritual Beliefs: Baptist      Lifestyle: no regular; diet is healthy         Occasionally drank ETOH.  Allergies  Allergen Reactions  . Shellfish Allergy      Outpatient Prescriptions Prior to Visit  Medication Sig Dispense Refill  . fluticasone (FLONASE) 50 MCG/ACT nasal spray Place 1 spray into the nose daily.    Marland Kitchen. ketorolac (ACULAR) 0.5 % ophthalmic solution Place 1 drop into the left eye 2 (two) times daily.    Marland Kitchen. lisinopril (PRINIVIL,ZESTRIL) 20 MG tablet Take 1 tablet by mouth  daily 90 tablet 3  . loteprednol (LOTEMAX) 0.5 % ophthalmic suspension Place 1 drop into the left eye 4 (four) times daily.    . pravastatin (PRAVACHOL) 40  MG tablet Take 1 tablet (40 mg total) by mouth daily. 90 tablet 3   No facility-administered medications prior to visit.   No orders of the defined types were placed in this encounter.          Objective:   Physical Exam   Vitals:  Filed Vitals:   01/14/16 1519  BP: 100/64  Pulse: 77  Height: 5\' 10"  (1.778 m)  Weight: 193 lb (87.544 kg)  SpO2: 97%    Constitutional/General:  Pleasant, well-nourished, well-developed, not in any distress,  Comfortably seating.  Well kempt  Body mass index is 27.69 kg/(m^2). Wt Readings from Last 3 Encounters:  01/14/16 193 lb (87.544 kg)  01/13/16 192 lb 14.4 oz (87.499 kg)  04/03/15 197 lb 8 oz (89.585 kg)    Neck  circumference: 17 in  HEENT: Pupils equal and reactive to light and accommodation. Anicteric sclerae. Normal nasal mucosa.   No oral  lesions,  mouth clear,  oropharynx clear, no postnasal drip. (-) Oral thrush. No dental caries. Large tongue Airway - Mallampati class III-IV  Neck: No masses. Midline trachea. No JVD, (-) LAD. (-) bruits appreciated.  Respiratory/Chest: Grossly normal chest. (-) deformity. (-) Accessory muscle use.  Symmetric expansion. (-) Tenderness on palpation.  Resonant on percussion.  Diminished BS on both lower lung zones. (-) wheezing, crackles, rhonchi (-) egophony  Cardiovascular: Regular rate and  rhythm, heart sounds normal, no murmur or gallops, no peripheral edema  Gastrointestinal:  Normal bowel sounds. Soft, non-tender. No hepatosplenomegaly.  (-) masses.   Musculoskeletal:  Normal muscle tone. Normal gait.   Extremities: Grossly normal. (-) clubbing, cyanosis.  (-) edema  Skin: (-) rash,lesions seen.   Neurological/Psychiatric : alert, oriented to time, place, person. Normal mood and affect           Assessment & Plan:  Hypersomnia  Pt has snoring, witnessed apneas. Some gasping or choking.  Sleeps 7-8 hrs/night. Feels unrefreshed in am.  (-) morning headache.  Mild hypersomnia.  (-) abnormal behavior in sleep.  Wife is bothered by snoring/apneas  so patient is being seen here today.  ESS 9.   Plan:  We discussed about the diagnosis of Obstructive Sleep Apnea (OSA) and implications of untreated OSA. We discussed about CPAP and BiPaP as possible treatment options.   We will schedule the patient for a sleep study. Plan for a HST if insurance will cover. If not he is OK with lab study.  May opt not to Rx if only mild OSA as he is healthy. Need to call pt with results.  Pt has allergies/sinus issues which can get worse with cpap. May need to use flonase regularly. He stops flonase during summer.    Patient was instructed to call the  office if he/she has not heard back from the office 1-2 weeks after the sleep study.   Patient was instructed to call the office if he/she is having issues with the PAP device.   We discussed good sleep hygiene.   Patient was advised not to engage in activities requiring concentration and/or vigilance if he/she is sleepy.  Patient was advised not to drive if he/she is sleepy.    Allergic rhinitis Cont allergy shots q 2 weeks. Flonase prn. Cont meds.  Stable. May flare up with cpap.      Thank you very much for letting me participate in this patient's care. Please do not hesitate to give me a call if you have any questions or concerns regarding the  treatment plan.   Patient will follow up with me in 6-8 weeks.     Pollie Meyer, MD 01/14/2016   3:56 PM Pulmonary and Critical Care Medicine [facility] Pager: [phone] Office: [phone], Fax: [phone]

## 2016-01-14 NOTE — Patient Instructions (Signed)

## 2016-01-26 DIAGNOSIS — G4733 Obstructive sleep apnea (adult) (pediatric): Secondary | ICD-10-CM

## 2016-01-28 ENCOUNTER — Telehealth: Payer: Self-pay | Admitting: Pulmonary Disease

## 2016-01-28 DIAGNOSIS — G4733 Obstructive sleep apnea (adult) (pediatric): Secondary | ICD-10-CM

## 2016-01-28 NOTE — Telephone Encounter (Signed)
  I called up the pt and told him the HST was (+) for mild-moderate OSA.   Pt stops breathing 8   times an hour.   Home sleep study was done on : 01/26/16  We decided to treat with cpap and see if he feels better with cpap.   Sheena : Please order autoCPAP 5-10 cm H2O. Patient will need a mask fitting session. Patient will need a 1 month download.   Patient needs to be seen by me or any of the NPs/APPs  4-6 weeks after obtaining the cpap machine. Let me know if you receive this.   Thanks!   J. Alexis Frock, MD 01/28/2016, 4:55 PM

## 2016-01-29 NOTE — Telephone Encounter (Signed)
Order for CPAP placed. Nothing further needed.

## 2016-02-04 ENCOUNTER — Other Ambulatory Visit: Payer: Self-pay | Admitting: *Deleted

## 2016-02-04 DIAGNOSIS — G4733 Obstructive sleep apnea (adult) (pediatric): Secondary | ICD-10-CM

## 2016-02-10 ENCOUNTER — Other Ambulatory Visit: Payer: 59

## 2016-02-17 ENCOUNTER — Other Ambulatory Visit: Payer: Self-pay | Admitting: Family Medicine

## 2016-03-24 ENCOUNTER — Ambulatory Visit (INDEPENDENT_AMBULATORY_CARE_PROVIDER_SITE_OTHER): Payer: 59 | Admitting: Pulmonary Disease

## 2016-03-24 ENCOUNTER — Encounter: Payer: Self-pay | Admitting: Pulmonary Disease

## 2016-03-24 VITALS — BP 128/88 | HR 78 | Ht 70.0 in | Wt 197.0 lb

## 2016-03-24 DIAGNOSIS — J301 Allergic rhinitis due to pollen: Secondary | ICD-10-CM | POA: Diagnosis not present

## 2016-03-24 DIAGNOSIS — G4733 Obstructive sleep apnea (adult) (pediatric): Secondary | ICD-10-CM | POA: Diagnosis not present

## 2016-03-24 NOTE — Assessment & Plan Note (Signed)
  Pt has snoring, witnessed apneas. Some gasping or choking.  Sleeps 7-8 hrs/night. Feels unrefreshed in am.  (-) morning headache.  Mild hypersomnia.  (-) abnormal behavior in sleep.  Wife is bothered by snoring/apneas  so patient is being seen here today.  ESS 9.   Sleep study in July 2017 : AHI 8.  Used cpap but is NOY significantly better.  He is uncomfortable.  Mask bothers hime.  He is otherwise generally healthy.   Plan:  We discussed about the diagnosis of Obstructive Sleep Apnea (OSA) and implications of untreated OSA. We discussed about CPAP and BiPaP as possible treatment options.    As pt is NOT too symptomatic and only has mild OSA and is generally health, we decided we will hold off on cpap.  He was advised to observe for worsening of his sx or weight gain.  At that time, we can rpt a sleep study and re-consider cpap.   Will d/c cpap.

## 2016-03-24 NOTE — Patient Instructions (Signed)
  It was a pleasure taking care of you today!  We will discontinue her CPAP as we had discussed.  Return to clinic in as needed.

## 2016-03-24 NOTE — Assessment & Plan Note (Signed)
Cont allergy shots q 2 weeks. Flonase prn. Cont meds.  Stable.

## 2016-03-24 NOTE — Progress Notes (Signed)
Subjective:    Patient ID: Cody Simmons, male    DOB: 10/30/1960, 55 y.o.   MRN: [id number]  HPI   This is the case of Cody Simmons, 55 y.o. Male, who was referred by Dr. Kriste BasqueHannah Kim in consultation regarding possible OSA.   As you very well know, patient is a non smoker, not known to have asthma or copd.   Has has chronic allergic rhinitis/sinusitis for which he gets allergy shots q 2 weeks. Sees an allergy MD.  Pt has snoring, witnessed apneas. Some gasping or choking.  Sleeps 7-8 hrs/night. Feels unrefreshed in am.  (-) morning headache.  Mild hypersomnia.  (-) abnormal behavior in sleep.  Wife is bothered by snoring/apneas  so patient is being seen here today.   ESS 9.   ROS  03/24/16   Patient returns to the office as follow-up on his sleep apnea. He had a home sleep test which showed an AHI of 8. Download the last 1 month: 27%, AHI 0.4.  He is NOT better with cpap.  He is uncomfortable with it.  Not a pressure issue.  Mask comes off. He ends up waking up more 2/2 mask.     Review of Systems  Constitutional: Negative for activity change, appetite change, chills, fever and unexpected weight change.  HENT: Negative for congestion, dental problem, postnasal drip, rhinorrhea, sneezing, sore throat, trouble swallowing and voice change.   Eyes: Negative for visual disturbance.  Respiratory: Negative for cough, choking and shortness of breath.   Cardiovascular: Negative for chest pain and leg swelling.  Gastrointestinal: Negative for abdominal pain, nausea and vomiting.  Genitourinary: Negative for difficulty urinating.  Musculoskeletal: Negative for arthralgias.  Skin: Negative for rash.  Psychiatric/Behavioral: Negative for behavioral problems and confusion.  All other systems reviewed and are negative.  Past Medical History:  Diagnosis Date  . Allergy   . Diverticulosis of colon (without mention of hemorrhage)   . GERD (gastroesophageal reflux disease)   . Hiatal  hernia   . Hypertension   . Retinal necrosis, acute, left   . Spinal stenosis    saw [place] [facility] in 2015 s/p epidural inj   (-) CA, DVT  Family History  Problem Relation Age of Onset  . Hypertension Mother   . Kidney cancer Mother   . Hypertension Father   . Hyperlipidemia Father      Past Surgical History:  Procedure Laterality Date  . CATARACT EXTRACTION      Social History   Social History  . Marital status: Divorced    Spouse name: N/A  . Number of children: N/A  . Years of education: N/A   Occupational History  . 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 Smoking status: Never Smoker  . Smokeless tobacco: Never Used  . Alcohol use No  . Drug use: No  . Sexual activity: Not on file   Other Topics Concern  . Not on file   Social History Narrative   Work or School: retired, Dispensing opticianlaw enforcement      Home Situation: lives with wife      Spiritual Beliefs: Baptist      Lifestyle: no regular; diet is healthy         Occasionally drank ETOH.  Allergies  Allergen Reactions  . Shellfish Allergy      Outpatient Medications Prior to Visit  Medication Sig Dispense Refill  . fluticasone (FLONASE) 50 MCG/ACT nasal spray Place 1 spray into the  nose daily.    Marland Kitchen ketorolac (ACULAR) 0.5 % ophthalmic solution Place 1 drop into the left eye 2 (two) times daily.    Marland Kitchen lisinopril (PRINIVIL,ZESTRIL) 20 MG tablet Take 1 tablet by mouth  daily 90 tablet 1  . loteprednol (LOTEMAX) 0.5 % ophthalmic suspension Place 1 drop into the left eye 4 (four) times daily.    . pravastatin (PRAVACHOL) 40 MG tablet Take 1 tablet (40 mg total) by mouth daily. 90 tablet 3   No facility-administered medications prior to visit.    No orders of the defined types were placed in this encounter.         Objective:   Physical Exam   Vitals:  Vitals:   03/24/16 1453  BP: 128/88  Pulse: 78  SpO2: 97%  Weight: 197 lb (89.4 kg)  Height: 5\' 10"  (1.778 m)     Constitutional/General:  Pleasant, well-nourished, well-developed, not in any distress,  Comfortably seating.  Well kempt  Body mass index is 28.27 kg/m. Wt Readings from Last 3 Encounters:  03/24/16 197 lb (89.4 kg)  01/14/16 193 lb (87.5 kg)  01/13/16 192 lb 14.4 oz (87.5 kg)    Neck circumference: 17 in  HEENT: Pupils equal and reactive to light and accommodation. Anicteric sclerae. Normal nasal mucosa.   No oral  lesions,  mouth clear,  oropharynx clear, no postnasal drip. (-) Oral thrush. No dental caries. Large tongue Airway - Mallampati class III-IV  Neck: No masses. Midline trachea. No JVD, (-) LAD. (-) bruits appreciated.  Respiratory/Chest: Grossly normal chest. (-) deformity. (-) Accessory muscle use.  Symmetric expansion. (-) Tenderness on palpation.  Resonant on percussion.  Diminished BS on both lower lung zones. (-) wheezing, crackles, rhonchi (-) egophony  Cardiovascular: Regular rate and  rhythm, heart sounds normal, no murmur or gallops, no peripheral edema  Gastrointestinal:  Normal bowel sounds. Soft, non-tender. No hepatosplenomegaly.  (-) masses.   Musculoskeletal:  Normal muscle tone. Normal gait.   Extremities: Grossly normal. (-) clubbing, cyanosis.  (-) edema  Skin: (-) rash,lesions seen.   Neurological/Psychiatric : alert, oriented to time, place, person. Normal mood and affect           Assessment & Plan:  OSA (obstructive sleep apnea)  Pt has snoring, witnessed apneas. Some gasping or choking.  Sleeps 7-8 hrs/night. Feels unrefreshed in am.  (-) morning headache.  Mild hypersomnia.  (-) abnormal behavior in sleep.  Wife is bothered by snoring/apneas  so patient is being seen here today.  ESS 9.   Sleep study in July 2017 : AHI 8.  Used cpap but is NOY significantly better.  He is uncomfortable.  Mask bothers hime.  He is otherwise generally healthy.   Plan:  We discussed about the diagnosis of Obstructive Sleep Apnea  (OSA) and implications of untreated OSA. We discussed about CPAP and BiPaP as possible treatment options.    As pt is NOT too symptomatic and only has mild OSA and is generally health, we decided we will hold off on cpap.  He was advised to observe for worsening of his sx or weight gain.  At that time, we can rpt a sleep study and re-consider cpap.   Will d/c cpap.   Allergic rhinitis Cont allergy shots q 2 weeks. Flonase prn. Cont meds.  Stable.     Patient will follow up with me as needed.     Pollie Meyer, MD 03/24/2016   7:01 PM Pulmonary and Critical Care  [place] Pager: [phone] Office: [phone], Fax: [phone]

## 2016-04-04 NOTE — Progress Notes (Signed)
HPI:  Here for CPE:  -Concerns and/or follow up today:  OSA: -saw pulm -mild per his report and did not tolerate CPAP  Seasonal allergies/shellfish allergy: -chronic -goes to allergist and gets allergy shots -meds: flonase and zyrtec  HTN/mild HLD: -dx a long time ago -meds: lisinopril 20mg , pravastatin 40 -denies: CP, SOB, swelling -no regular exercise, reports diet up and down  GERD: -hx of hiatal hernia -take protonix prn a few times per year -sees Dr. Russella Dar -denies hx stricture, dysphagia  Hx of Acute retinal necrosis: -sees Dr. Luciana Axe   -Diet: not great  -Exercise: no regular exercise  -Diabetes and Dyslipidemia Screening: FASTING for labs  -Hx of HTN: no  -Vaccines: declined flu shot, plans to get at work  -sexual activity: yes, male partner, no new partners  -wants STI testing, Hep C screening (if born 27-1967): agrees to hep c screening, declineds HIV  -FH colon or prstate ca: see FH Last colon cancer screening: per abstracted data with prior PCP, 2009, normal Last prostate ca screening: PSA done 2015, after discussion risks/benefits he wants to do again, declined DRE  -Alcohol, Tobacco, drug use: see social history  Review of Systems - no fevers, unintentional weight loss, vision loss, hearing loss, chest pain, sob, hemoptysis, melena, hematochezia, hematuria, genital discharge, changing or concerning skin lesions, bleeding, bruising, loc, thoughts of self harm or SI  Past Medical History:  Diagnosis Date  . Allergy   . Diverticulosis of colon (without mention of hemorrhage)   . GERD (gastroesophageal reflux disease)   . Hiatal hernia   . Hypertension   . Retinal necrosis, acute, left   . Spinal stenosis    saw [place] [facility] in 2015 s/p epidural inj    Past Surgical History:  Procedure Laterality Date  . CATARACT EXTRACTION      Family History  Problem Relation Age of Onset  . Hypertension Mother   . Kidney cancer Mother   .  Hypertension Father   . Hyperlipidemia Father     Social History   Social History  . Marital status: Divorced    Spouse name: N/A  . Number of children: N/A  . Years of education: N/A   Occupational History  . Emergency planning/management officer Unemployed   Social History Main Topics  . Smoking status: Never Smoker  . Smokeless tobacco: Never Used  . Alcohol use No  . Drug use: No  . Sexual activity: Not Asked   Other Topics Concern  . 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  fluticasone (FLONASE) 50 MCG/ACT nasal spray, Place 1 spray into the nose daily., Disp: , Rfl:  .  ketorolac (ACULAR) 0.5 % ophthalmic solution, Place 1 drop into the left eye 2 (two) times daily., Disp: , Rfl:  .  lisinopril (PRINIVIL,ZESTRIL) 20 MG tablet, Take 1 tablet by mouth  daily, Disp: 90 tablet, Rfl: 1 .  loteprednol (LOTEMAX) 0.5 % ophthalmic suspension, Place 1 drop into the left eye daily. , Disp: , Rfl:  .  pravastatin (PRAVACHOL) 40 MG tablet, Take 1 tablet (40 mg total) by mouth daily., Disp: 90 tablet, Rfl: 3  EXAM:  Vitals:   04/05/16 0817  BP: 110/80  Pulse: 68  Temp: 98.2 F (36.8 C)  TempSrc: Oral  Weight: 197 lb 3.2 oz (89.4 kg)  Height: 5\' 10"  (1.778 m)    Estimated body mass index is 28.3 kg/m as calculated from the following:   Height as of this encounter: 5\' 10"  (1.778 m).   Weight as of this encounter: 197 lb 3.2 oz (89.4 kg).  GENERAL: vitals reviewed and listed below, alert, oriented, appears well hydrated and in no acute distress  HEENT: head atraumatic, PERRLA, normal appearance of eyes, ears, nose and mouth. moist mucus membranes.  NECK: supple, no masses or lymphadenopathy  LUNGS: clear to auscultation bilaterally, no rales, rhonchi or wheeze  CV: HRRR, no peripheral  edema or cyanosis, normal pedal pulses  ABDOMEN: bowel sounds normal, soft, non tender to palpation, no masses, no rebound or guarding  GU: declined  RECTAL: refused  SKIN: no rash or abnormal lesions  MS: normal gait, moves all extremities normally  NEURO: normal gait, speech and thought processing grossly intact, muscle tone grossly intact throughout  PSYCH: normal affect, pleasant and cooperative  ASSESSMENT AND PLAN:  Discussed the following assessment and plan:  Encounter for preventive health examination - Plan: Hep C Antibody, PSA, Hemoglobin A1c  Mixed hyperlipidemia - Plan: Lipid panel  Essential hypertension - Plan: Basic metabolic panel, CBC (no diff)  OSA (obstructive sleep apnea)   -Discussed and advised all [place] preventive services health task force level A and B recommendations for age, sex and risks.  --Advised at least 150 minutes of exercise per week and a healthy diet with avoidance of (less then 1 serving per week) processed foods, white starches, red meat, fast foods and sweets and consisting of: * 5-9 servings of fresh fruits and vegetables (not corn or potatoes) *nuts and seeds, beans *olives and olive oil *lean meats such as fish and white chicken  *whole grains  -FASTING labs, studies and vaccines per orders this encounter   Patient advised to return to clinic immediately if symptoms worsen or persist or new concerns.  Patient Instructions  BEFORE YOU LEAVE: -follow up: 4 months -labs  Please get your flu shot.  Vit D3 [phone] daily  We have ordered labs or studies at this visit. It can take up to 1-2 weeks for results and processing. IF results require follow up or explanation, we will call you with instructions. Clinically stable results will be released to your [facility]. If you have not heard from [place] or cannot find your results in [facility] in 2 weeks please contact our office at [phone].  If you are not yet signed up for [facility],  please consider signing up.   We recommend the following healthy lifestyle for LIFE: 1) Small portions.   Tip: eat off of a salad plate instead of a dinner plate.  Tip: It is ok to feel hungry after a meal - that likely means you ate an appropriate portion.  Tip: if you need more or a snack choose fruits, veggies and/or a handful of nuts or seeds.  2) Eat a healthy clean diet.  * Tip: Avoid (less then 1 serving per week): processed foods, sweets, sweetened drinks, white starches (rice, flour, bread, potatoes, pasta, etc), red meat, fast foods, butter  *Tip: CHOOSE instead   * 5-9 servings per day of fresh or frozen fruits and vegetables (but not corn, potatoes, bananas, canned or dried fruit)   *nuts and seeds, beans   *olives and olive oil   *small portions of lean meats such  as fish and white chicken    *small portions of whole grains  3)Get at least 150 minutes of sweaty aerobic exercise per week.  4)Reduce stress - consider counseling, meditation and relaxation to balance other aspects of your life.          No Follow-up on file.   Kriste BasqueKIM, Janyiah Silveri R., DO

## 2016-04-05 ENCOUNTER — Encounter: Payer: Self-pay | Admitting: Family Medicine

## 2016-04-05 ENCOUNTER — Encounter: Payer: 59 | Admitting: Family Medicine

## 2016-04-05 ENCOUNTER — Ambulatory Visit (INDEPENDENT_AMBULATORY_CARE_PROVIDER_SITE_OTHER): Payer: 59 | Admitting: Family Medicine

## 2016-04-05 VITALS — BP 110/80 | HR 68 | Temp 98.2°F | Ht 70.0 in | Wt 197.2 lb

## 2016-04-05 DIAGNOSIS — E782 Mixed hyperlipidemia: Secondary | ICD-10-CM | POA: Diagnosis not present

## 2016-04-05 DIAGNOSIS — G4733 Obstructive sleep apnea (adult) (pediatric): Secondary | ICD-10-CM | POA: Diagnosis not present

## 2016-04-05 DIAGNOSIS — Z0000 Encounter for general adult medical examination without abnormal findings: Secondary | ICD-10-CM

## 2016-04-05 DIAGNOSIS — I10 Essential (primary) hypertension: Secondary | ICD-10-CM | POA: Diagnosis not present

## 2016-04-05 LAB — LIPID PANEL
Cholesterol: 143 mg/dL (ref 0–200)
HDL: 39 mg/dL — ABNORMAL LOW (ref >39.00)
LDL Cholesterol: 79 mg/dL (ref 0–99)
NonHDL: 104.04
Total CHOL/HDL Ratio: 4
Triglycerides: 127 mg/dL (ref 0.0–149.0)
VLDL: 25.4 mg/dL (ref 0.0–40.0)

## 2016-04-05 LAB — BASIC METABOLIC PANEL
BUN: 9 mg/dL (ref 6–23)
CO2: 27 mEq/L (ref 19–32)
Calcium: 9.4 mg/dL (ref 8.4–10.5)
Chloride: 106 mEq/L (ref 96–112)
Creatinine, Ser: 1.36 mg/dL (ref 0.40–1.50)
GFR: 69.49 mL/min (ref >60.00)
Glucose, Bld: 83 mg/dL (ref 70–99)
Potassium: 4.7 mEq/L (ref 3.5–5.1)
Sodium: 140 mEq/L (ref 135–145)

## 2016-04-05 LAB — CBC
HCT: 46.5 % (ref 39.0–52.0)
Hemoglobin: 16 g/dL (ref 13.0–17.0)
MCHC: 34.4 g/dL (ref 30.0–36.0)
MCV: 92.4 fl (ref 78.0–100.0)
Platelets: 241 10*3/uL (ref 150.0–400.0)
RBC: 5.03 Mil/uL (ref 4.22–5.81)
RDW: 14.1 % (ref 11.5–15.5)
WBC: 6 10*3/uL (ref 4.0–10.5)

## 2016-04-05 LAB — HEMOGLOBIN A1C: Hgb A1c MFr Bld: 5.5 % (ref 4.6–6.5)

## 2016-04-05 LAB — PSA: PSA: 0.76 ng/mL (ref 0.10–4.00)

## 2016-04-05 NOTE — Progress Notes (Signed)
Pre visit review using our clinic review tool, if applicable. No additional management support is needed unless otherwise documented below in the visit note. 

## 2016-04-05 NOTE — Patient Instructions (Signed)
BEFORE YOU LEAVE: -follow up: 4 months -labs  Please get your flu shot.  Vit D3 [phone] IU daily  We have ordered labs or studies at this visit. It can take up to 1-2 weeks for results and processing. IF results require follow up or explanation, we will call you with instructions. Clinically stable results [facility]. If you have not heard from us or cannot find your [facility] in 2 weeks please contact our office at [phone].  If you are not yet signed up for [facility], please consider signing up.   We recommend the following healthy lifestyle for LIFE: 1) Small portions.   Tip: eat off of a salad plate instead of a dinner plate.  Tip: It is ok to feel hungry after a meal - that likely means you ate an appropriate portion.  Tip: if you need more or a snack choose fruits, veggies and/or a handful of nuts or seeds.  2) Eat a healthy clean diet.  * Tip: Avoid (less then 1 serving per week): processed foods, sweets, sweetened drinks, white starches (rice, flour, bread, potatoes, pasta, etc), red meat, fast foods, butter  *Tip: CHOOSE instead   * 5-9 servings per day of fresh or frozen fruits and vegetables (but not corn, potatoes, bananas, canned or dried fruit)   *nuts and seeds, beans   *olives and olive oil   *small portions of lean meats such as fish and white chicken    *small portions of whole grains  3)Get at least 150 minutes of sweaty aerobic exercise per week.  4)Reduce stress - consider counseling, meditation and relaxation to balance other aspects of your life.

## 2016-04-06 LAB — HEPATITIS C ANTIBODY: HCV Ab: NEGATIVE

## 2016-04-21 ENCOUNTER — Encounter: Payer: Self-pay | Admitting: Pulmonary Disease

## 2016-05-15 ENCOUNTER — Other Ambulatory Visit: Payer: Self-pay | Admitting: Family Medicine

## 2016-08-06 ENCOUNTER — Ambulatory Visit: Payer: 59 | Admitting: Family Medicine

## 2016-08-11 DIAGNOSIS — H30142 Acute posterior multifocal placoid pigment epitheliopathy, left eye: Secondary | ICD-10-CM | POA: Diagnosis not present

## 2016-08-11 DIAGNOSIS — H353122 Nonexudative age-related macular degeneration, left eye, intermediate dry stage: Secondary | ICD-10-CM | POA: Diagnosis not present

## 2016-08-11 DIAGNOSIS — H35352 Cystoid macular degeneration, left eye: Secondary | ICD-10-CM | POA: Diagnosis not present

## 2016-08-26 ENCOUNTER — Other Ambulatory Visit: Payer: Self-pay | Admitting: Family Medicine

## 2016-10-30 ENCOUNTER — Other Ambulatory Visit: Payer: Self-pay | Admitting: Family Medicine

## 2016-11-15 ENCOUNTER — Other Ambulatory Visit: Payer: Self-pay | Admitting: Family Medicine

## 2016-12-29 ENCOUNTER — Encounter: Payer: Self-pay | Admitting: Gastroenterology

## 2017-01-11 ENCOUNTER — Encounter: Payer: Self-pay | Admitting: Gastroenterology

## 2017-01-20 ENCOUNTER — Other Ambulatory Visit: Payer: Self-pay | Admitting: Family Medicine

## 2017-02-04 ENCOUNTER — Other Ambulatory Visit: Payer: Self-pay | Admitting: Family Medicine

## 2017-02-11 DIAGNOSIS — J3089 Other allergic rhinitis: Secondary | ICD-10-CM | POA: Diagnosis not present

## 2017-02-11 DIAGNOSIS — J301 Allergic rhinitis due to pollen: Secondary | ICD-10-CM | POA: Diagnosis not present

## 2017-02-28 DIAGNOSIS — J3089 Other allergic rhinitis: Secondary | ICD-10-CM | POA: Diagnosis not present

## 2017-02-28 DIAGNOSIS — J301 Allergic rhinitis due to pollen: Secondary | ICD-10-CM | POA: Diagnosis not present

## 2017-02-28 DIAGNOSIS — Z91013 Allergy to seafood: Secondary | ICD-10-CM | POA: Diagnosis not present

## 2017-03-02 ENCOUNTER — Ambulatory Visit (AMBULATORY_SURGERY_CENTER): Payer: Self-pay | Admitting: *Deleted

## 2017-03-02 VITALS — Ht 69.5 in | Wt 199.0 lb

## 2017-03-02 DIAGNOSIS — Z1211 Encounter for screening for malignant neoplasm of colon: Secondary | ICD-10-CM

## 2017-03-02 MED ORDER — NA SULFATE-K SULFATE-MG SULF 17.5-3.13-1.6 GM/177ML PO SOLN: ORAL | 0 refills | Status: DC

## 2017-03-02 NOTE — Progress Notes (Signed)
Patient denies any allergies to eggs or soy. Patient denies any problems with anesthesia/sedation. Patient denies any oxygen use at home and does not take any diet/weight loss medications. Patient has "mild" sleep apnea. EMMI education assisgned to patient on colonoscopy, this was explained and instructions given to patient.

## 2017-03-03 ENCOUNTER — Encounter: Payer: Self-pay | Admitting: Gastroenterology

## 2017-03-09 DIAGNOSIS — J301 Allergic rhinitis due to pollen: Secondary | ICD-10-CM | POA: Diagnosis not present

## 2017-03-09 DIAGNOSIS — J3089 Other allergic rhinitis: Secondary | ICD-10-CM | POA: Diagnosis not present

## 2017-03-16 ENCOUNTER — Ambulatory Visit (AMBULATORY_SURGERY_CENTER): Payer: 59 | Admitting: Gastroenterology

## 2017-03-16 ENCOUNTER — Encounter: Payer: Self-pay | Admitting: Gastroenterology

## 2017-03-16 VITALS — BP 110/77 | HR 64 | Temp 98.9°F | Resp 11 | Ht 69.0 in | Wt 199.0 lb

## 2017-03-16 DIAGNOSIS — Z1211 Encounter for screening for malignant neoplasm of colon: Secondary | ICD-10-CM | POA: Diagnosis present

## 2017-03-16 MED ORDER — SODIUM CHLORIDE 0.9 % IV SOLN: 500.0000 mL | INTRAVENOUS | Status: DC

## 2017-03-16 NOTE — Op Note (Signed)
[facility] Patient Name: Cody Simmons Procedure Date: 03/16/2017 1:25 PM MRN: [id number] Endoscopist: Meryl Dare , MD Age: 56 Referring MD:  Date of Birth: 1960-09-14 Gender: Male Account #: [id number] Procedure:                Colonoscopy Indications:              Screening for colorectal malignant neoplasm Medicines:                Monitored Anesthesia Care Procedure:                Pre-Anesthesia Assessment:                           - Prior to the procedure, a History and Physical                            was performed, and patient medications and                            allergies were reviewed. The patient's tolerance of                            previous anesthesia was also reviewed. The risks                            and benefits of the procedure and the sedation                            options and risks were discussed with the patient.                            All questions were answered, and informed consent                            was obtained. Prior Anticoagulants: The patient has                            taken no previous anticoagulant or antiplatelet                            agents. ASA Grade Assessment: II - A patient with                            mild systemic disease. After reviewing the risks                            and benefits, the patient was deemed in                            satisfactory condition to undergo the procedure.                           After obtaining informed consent, the colonoscope  was passed under direct vision. Throughout the                            procedure, the patient's blood pressure, pulse, and                            oxygen saturations were monitored continuously. The                            Model PCF-[id number]) scope was introduced                            through the anus and advanced to the the cecum,                            identified by  appendiceal orifice and ileocecal                            valve. The ileocecal valve, appendiceal orifice,                            and rectum were photographed. The quality of the                            bowel preparation was good. The colonoscopy was                            performed without difficulty. The patient tolerated                            the procedure well. Scope In: 1:43:03 PM Scope Out: 1:59:51 PM Scope Withdrawal Time: 0 hours 14 minutes 7 seconds  Total Procedure Duration: 0 hours 16 minutes 48 seconds  Findings:                 The perianal and digital rectal examinations were                            normal.                           Many medium-mouthed diverticula were found in the                            left colon. There was no evidence of diverticular                            bleeding.                           Internal hemorrhoids were found during                            retroflexion. The hemorrhoids were small and Grade  I (internal hemorrhoids that do not prolapse).                           The exam was otherwise without abnormality on                            direct and retroflexion views. Complications:            No immediate complications. Estimated blood loss:                            None. Estimated Blood Loss:     Estimated blood loss: none. Impression:               - Mild diverticulosis in the left colon. There was                            no evidence of diverticular bleeding.                           - Internal hemorrhoids.                           - The examination was otherwise normal on direct                            and retroflexion views.                           - No specimens collected. Recommendation:           - Repeat colonoscopy in 10 years for screening                            purposes.                           - Patient has a contact number available for                             emergencies. The signs and symptoms of potential                            delayed complications were discussed with the                            patient. Return to normal activities tomorrow.                            Written discharge instructions were provided to the                            patient.                           - High fiber diet.                           -  Continue present medications. Meryl DareMalcolm T Itsel Opfer, MD 03/16/2017 2:11:18 PM This report has been signed electronically.

## 2017-03-16 NOTE — Progress Notes (Signed)
To PACU, VSS. Report to RN.tb 

## 2017-03-16 NOTE — Patient Instructions (Signed)
YOU HAD AN ENDOSCOPIC PROCEDURE TODAY AT THE [facility]:   Refer to the procedure report that was given to you for any specific questions about what was found during the examination.  If the procedure report does not answer your questions, please call your gastroenterologist to clarify.  If you requested that your care partner not be given the details of your procedure findings, then the procedure report has been included in a sealed envelope for you to review at your convenience later.  YOU SHOULD EXPECT: Some feelings of bloating in the abdomen. Passage of more gas than usual.  Walking can help get rid of the air that was put into your GI tract during the procedure and reduce the bloating. If you had a lower endoscopy (such as a colonoscopy or flexible sigmoidoscopy) you may notice spotting of blood in your stool or on the toilet paper. If you underwent a bowel prep for your procedure, you may not have a normal bowel movement for a few days.  Please Note:  You might notice some irritation and congestion in your nose or some drainage.  This is from the oxygen used during your procedure.  There is no need for concern and it should clear up in a day or so.  SYMPTOMS TO REPORT IMMEDIATELY:   Following lower endoscopy (colonoscopy or flexible sigmoidoscopy):  Excessive amounts of blood in the stool  Significant tenderness or worsening of abdominal pains  Swelling of the abdomen that is new, acute  Fever of 100F or higher    For urgent or emergent issues, a gastroenterologist can be reached at any hour by calling [phone].   DIET:  We do recommend a small meal at first, but then you may proceed to your regular diet.  Drink plenty of fluids but you should avoid alcoholic beverages for 24 hours.  ACTIVITY:  You should plan to take it easy for the rest of today and you should NOT DRIVE or use heavy machinery until tomorrow (because of the sedation medicines used during the test).     FOLLOW UP: Our staff will call the number listed on your records the next business day following your procedure to check on you and address any questions or concerns that you may have regarding the information given to you following your procedure. If we do not reach you, we will leave a message.  However, if you are feeling well and you are not experiencing any problems, there is no need to return our call.  We will assume that you have returned to your regular daily activities without incident.  If any biopsies were taken you will be contacted by phone or by letter within the next 1-3 weeks.  Please call us at [phone] if you have not heard about the biopsies in 3 weeks.    SIGNATURES/CONFIDENTIALITY: You and/or your care partner have signed paperwork which will be entered into your electronic medical record.  These signatures attest to the fact that that the information above on your After Visit Summary has been reviewed and is understood.  Full responsibility of the confidentiality of this discharge information lies with you and/or your care-partner.  Resume medications.Information given on diverticulosis and hemorrhoids. 

## 2017-03-17 ENCOUNTER — Telehealth: Payer: Self-pay | Admitting: *Deleted

## 2017-03-17 NOTE — Telephone Encounter (Signed)
  Follow up Call-  Call back number 03/16/2017  Post procedure Call Back phone  # [phone]  Permission to leave phone message Yes  Some recent data might be hidden     Patient questions:  Do you have a fever, pain , or abdominal swelling? No. Pain Score  0 *  Have you tolerated food without any problems? Yes.    Have you been able to return to your normal activities? Yes.    Do you have any questions about your discharge instructions: Diet   No. Medications  No. Follow up visit  No.  Do you have questions or concerns about your Care? No.  Actions: * If pain score is 4 or above: No action needed, pain <4.

## 2017-03-24 ENCOUNTER — Encounter: Payer: Self-pay | Admitting: Family Medicine

## 2017-04-11 ENCOUNTER — Other Ambulatory Visit: Payer: Self-pay | Admitting: Family Medicine

## 2017-04-28 ENCOUNTER — Other Ambulatory Visit: Payer: Self-pay | Admitting: Family Medicine

## 2017-07-01 ENCOUNTER — Other Ambulatory Visit: Payer: Self-pay | Admitting: Family Medicine

## 2017-07-14 ENCOUNTER — Encounter: Payer: Self-pay | Admitting: Family Medicine

## 2017-07-18 ENCOUNTER — Other Ambulatory Visit: Payer: Self-pay | Admitting: Family Medicine

## 2017-08-11 DIAGNOSIS — H353122 Nonexudative age-related macular degeneration, left eye, intermediate dry stage: Secondary | ICD-10-CM | POA: Diagnosis not present

## 2017-08-11 DIAGNOSIS — H30142 Acute posterior multifocal placoid pigment epitheliopathy, left eye: Secondary | ICD-10-CM | POA: Diagnosis not present

## 2017-08-11 DIAGNOSIS — H35352 Cystoid macular degeneration, left eye: Secondary | ICD-10-CM | POA: Diagnosis not present

## 2017-09-15 ENCOUNTER — Other Ambulatory Visit: Payer: Self-pay | Admitting: Family Medicine

## 2017-09-30 ENCOUNTER — Other Ambulatory Visit: Payer: Self-pay | Admitting: Family Medicine

## 2017-10-10 ENCOUNTER — Emergency Department (HOSPITAL_COMMUNITY): Payer: 59

## 2017-10-10 ENCOUNTER — Other Ambulatory Visit: Payer: Self-pay

## 2017-10-10 ENCOUNTER — Ambulatory Visit (HOSPITAL_COMMUNITY)
Admission: EM | Admit: 2017-10-10 | Discharge: 2017-10-10 | Disposition: A | Discharge status: Home / Self Care | Payer: 59 | Source: Home / Self Care

## 2017-10-10 ENCOUNTER — Emergency Department (HOSPITAL_COMMUNITY)
Admission: EM | Admit: 2017-10-10 | Discharge: 2017-10-10 | Disposition: A | Discharge status: Home / Self Care | Payer: 59 | Attending: Emergency Medicine | Admitting: Emergency Medicine

## 2017-10-10 ENCOUNTER — Encounter (HOSPITAL_COMMUNITY): Payer: Self-pay | Admitting: *Deleted

## 2017-10-10 DIAGNOSIS — Z79899 Other long term (current) drug therapy: Secondary | ICD-10-CM | POA: Diagnosis not present

## 2017-10-10 DIAGNOSIS — S63259A Unspecified dislocation of unspecified finger, initial encounter: Secondary | ICD-10-CM

## 2017-10-10 DIAGNOSIS — I10 Essential (primary) hypertension: Secondary | ICD-10-CM | POA: Insufficient documentation

## 2017-10-10 DIAGNOSIS — Y939 Activity, unspecified: Secondary | ICD-10-CM | POA: Diagnosis not present

## 2017-10-10 DIAGNOSIS — W19XXXA Unspecified fall, initial encounter: Secondary | ICD-10-CM | POA: Diagnosis not present

## 2017-10-10 DIAGNOSIS — Z23 Encounter for immunization: Secondary | ICD-10-CM | POA: Diagnosis not present

## 2017-10-10 DIAGNOSIS — S63296A Dislocation of distal interphalangeal joint of right little finger, initial encounter: Secondary | ICD-10-CM | POA: Diagnosis not present

## 2017-10-10 DIAGNOSIS — Y999 Unspecified external cause status: Secondary | ICD-10-CM | POA: Diagnosis not present

## 2017-10-10 DIAGNOSIS — S61209A Unspecified open wound of unspecified finger without damage to nail, initial encounter: Secondary | ICD-10-CM

## 2017-10-10 DIAGNOSIS — Y929 Unspecified place or not applicable: Secondary | ICD-10-CM | POA: Insufficient documentation

## 2017-10-10 DIAGNOSIS — S63256A Unspecified dislocation of right little finger, initial encounter: Secondary | ICD-10-CM | POA: Diagnosis not present

## 2017-10-10 MED ORDER — LIDOCAINE HCL 2 % IJ SOLN
20.0000 mL | Freq: Once | INTRAMUSCULAR | Status: AC
  Administered 2017-10-10: 400 mg
  Filled 2017-10-10: qty 20

## 2017-10-10 MED ORDER — SODIUM CHLORIDE 0.9 % IV SOLN
1.5000 g | Freq: Three times a day (TID) | INTRAVENOUS | Status: DC
  Filled 2017-10-10: qty 1.5

## 2017-10-10 MED ORDER — BUPIVACAINE HCL 0.5 % IJ SOLN
50.0000 mL | Freq: Once | INTRAMUSCULAR | Status: AC
  Administered 2017-10-10: 50 mL
  Filled 2017-10-10 (×2): qty 50

## 2017-10-10 MED ORDER — MORPHINE SULFATE (PF) 4 MG/ML IV SOLN
4.0000 mg | Freq: Once | INTRAVENOUS | Status: AC
  Administered 2017-10-10: 4 mg via INTRAVENOUS
  Filled 2017-10-10: qty 1

## 2017-10-10 MED ORDER — TETANUS-DIPHTH-ACELL PERTUSSIS 5-2.5-18.5 LF-MCG/0.5 IM SUSP
0.5000 mL | Freq: Once | INTRAMUSCULAR | Status: AC
  Administered 2017-10-10: 0.5 mL via INTRAMUSCULAR
  Filled 2017-10-10: qty 0.5

## 2017-10-10 MED ORDER — SODIUM CHLORIDE 0.9 % IV SOLN
3.0000 g | Freq: Once | INTRAVENOUS | Status: AC
  Administered 2017-10-10: 3 g via INTRAVENOUS
  Filled 2017-10-10: qty 3

## 2017-10-10 MED ORDER — CEPHALEXIN 500 MG PO CAPS: 500.0000 mg | ORAL_CAPSULE | Freq: Three times a day (TID) | ORAL | 0 refills | Status: DC

## 2017-10-10 NOTE — Discharge Instructions (Addendum)
Please read attached information. If you experience any new or worsening signs or symptoms please return to the emergency room for evaluation. Please follow-up with your primary care provider or specialist as discussed. Please use medication prescribed only as directed and discontinue taking if you have any concerning signs or symptoms.   °

## 2017-10-10 NOTE — ED Notes (Signed)
Patient transported to X-ray 

## 2017-10-10 NOTE — Progress Notes (Signed)
Pharmacy Antibiotic Note  Malachi Paradiseherron J Mulcahey is a 57 y.o. male admitted on 10/10/2017 with open fracture of right pinky finger. Pharmacy has been consulted for Unasyn dosing. Per surgery, planning fracture repair soon.   Plan: -Unasyn 3 gm IV once, then Unasyn 1.5 gm IV Q 8 hours until 24 hours after surgery.   Height: 5\' 10"  (177.8 cm) Weight: 189 lb (85.7 kg) IBW/kg (Calculated) : 73  Temp (24hrs), Avg:99 F (37.2 C), Min:99 F (37.2 C), Max:99 F (37.2 C)  No results for input(s): WBC, CREATININE, LATICACIDVEN, VANCOTROUGH, VANCOPEAK, VANCORANDOM, GENTTROUGH, GENTPEAK, GENTRANDOM, TOBRATROUGH, TOBRAPEAK, TOBRARND, AMIKACINPEAK, AMIKACINTROU, AMIKACIN in the last 168 hours.  CrCl cannot be calculated (Patient's most recent lab result is older than the maximum 21 days allowed.).    Allergies  Allergen Reactions  . Shellfish Allergy Anaphylaxis    Thank you for allowing pharmacy to be a part of this patient's care.  Vinnie LevelBenjamin Clydia Nieves, PharmD., BCPS Clinical Pharmacist Clinical phone for 10/10/17 until 11pm: [phone] If after 11pm, please call main pharmacy at: [phone]

## 2017-10-10 NOTE — ED Notes (Signed)
Pt wound cleaned with sterile saline. Pt wound covered with sterile saline moistened sterile gauze

## 2017-10-10 NOTE — ED Notes (Signed)
Pt changing into gown at this time

## 2017-10-10 NOTE — ED Provider Notes (Addendum)
[facility] EMERGENCY DEPARTMENT Provider Note   CSN: [id number] Arrival date & time: 10/10/17  1737     History   Chief Complaint Chief Complaint  Patient presents with  . Hand Pain    HPI Cody Simmons is a 57 y.o. male.  HPI   84 YOM right hand dominant patient SP fall with open wound to right pinky. Pt reports this happened at 5 pm approx. 45 minutes prior to arrival. Pt went to urgent care prior to ED eval and sent here. Patient reports pain to the finger, no other pain or injuries. Pt has not had food or drink today. Last tetanus unknown.   Past Medical History:  Diagnosis Date  . Allergy   . Diverticulosis of colon (without mention of hemorrhage)   . GERD (gastroesophageal reflux disease)   . Hiatal hernia   . Hypertension   . Retinal necrosis, acute, left   . Sleep apnea    "mild" per pt./does not use CPAP  . Spinal stenosis    saw [place] [facility] in 2015 s/p epidural inj    Patient Active Problem List   Diagnosis Date Noted  . OSA (obstructive sleep apnea) 03/24/2016  . Hypersomnia 01/14/2016  . GERD (gastroesophageal reflux disease) 12/24/2014  . Erectile disorder, acquired, situational, mild 12/24/2014  . MIXED HYPERLIPIDEMIA 06/07/2007  . Essential hypertension 03/17/2007  . Allergic rhinitis 03/17/2007    Past Surgical History:  Procedure Laterality Date  . CATARACT EXTRACTION    . COLONOSCOPY  2008   w/[place]  . RETINAL LASER PROCEDURE          Home Medications    Prior to Admission medications   Medication Sig Start Date End Date Taking? Authorizing Provider  famciclovir (FAMVIR) 500 MG tablet Take 500 mg by mouth daily.    Yes [provider]  ketorolac (ACULAR) 0.5 % ophthalmic solution Place 1 drop into the left eye 2 (two) times daily.   Yes [provider]  lisinopril (PRINIVIL,ZESTRIL) 20 MG tablet TAKE 1 TABLET BY MOUTH  DAILY 07/19/17  Yes Kriste Basque R, DO  loteprednol (LOTEMAX) 0.5 %  ophthalmic suspension Place 1 drop into the left eye daily.    Yes [provider]  pravastatin (PRAVACHOL) 40 MG tablet TAKE 1 TABLET BY MOUTH  DAILY 07/04/17  Yes Kriste Basque R, DO  cephALEXin (KEFLEX) 500 MG capsule Take 1 capsule (500 mg total) by mouth 3 (three) times daily. 10/10/17   Eyvonne Mechanic, PA-C    Family History Family History  Problem Relation Age of Onset  . Hypertension Mother   . Kidney cancer Mother   . Hypertension Father   . Hyperlipidemia Father   . Colon cancer Neg Hx     Social History Social History   Tobacco Use  . Smoking status: Never Smoker  . Smokeless tobacco: Never Used  Substance Use Topics  . Alcohol use: Yes    Comment: occasional  . Drug use: No     Allergies   Shellfish allergy   Review of Systems Review of Systems  All other systems reviewed and are negative.   Physical Exam Updated Vital Signs BP (!) 139/96   Pulse 85   Temp 99 F (37.2 C) (Oral)   Resp 14   Ht 5\' 10"  (1.778 m)   Wt 85.7 kg (189 lb)   SpO2 95%   BMI 27.12 kg/m   Physical Exam  Constitutional: He is oriented to person, place, and time.  He appears well-developed and well-nourished.  HENT:  Head: Normocephalic and atraumatic.  Eyes: Pupils are equal, round, and reactive to light. Conjunctivae are normal. Right eye exhibits no discharge. Left eye exhibits no discharge. No scleral icterus.  Neck: Normal range of motion. No JVD present. No tracheal deviation present.  Pulmonary/Chest: Effort normal. No stridor.  Musculoskeletal:  Distal cap refill intact - sensation intact  1.5 cm laceration noted below  Neurological: He is alert and oriented to person, place, and time. Coordination normal.  Psychiatric: He has a normal mood and affect. His behavior is normal. Judgment and thought content normal.  Nursing note and vitals reviewed.        ED Treatments / Results  Labs (all labs ordered are listed, but only abnormal results are  displayed) Labs Reviewed - No data to display  EKG None  Radiology Dg Finger Little Right  Result Date: 10/10/2017 CLINICAL DATA:  Finger dislocation, postreduction. EXAM: RIGHT LITTLE FINGER 2+V COMPARISON:  None. FINDINGS: Prior volar dislocation of the distal phalanx has been reduced. The alignment is now anatomic. No visualized fracture. There is soft tissue edema about the digit. IMPRESSION: Successful reduction of distal phalanx dislocation. No visualized fracture. Electronically Signed   By: Rubye Oaks M.D.   On: 10/10/2017 21:40   Dg Finger Little Right  Result Date: 10/10/2017 CLINICAL DATA:  Right fifth finger fracture after fall. EXAM: RIGHT LITTLE FINGER 2+V COMPARISON:  None. FINDINGS: There is noted complete volar dislocation of fifth distal phalanx relative to fifth middle phalanx. There is a large associated laceration with distal tip of fifth middle phalanx exposed. No other abnormality is noted. IMPRESSION: Complete volar dislocation of fifth distal phalanx relative to fifth middle phalanx, with associated laceration resulting in distal tip of fifth middle phalanx exposed to air. Electronically Signed   By: Lupita Raider, M.D.   On: 10/10/2017 20:44    Procedures Reduction of dislocation Date/Time: 10/10/2017 9:32 PM Performed by: Eyvonne Mechanic, PA-C Authorized by: Eyvonne Mechanic, PA-C  Consent: Verbal consent obtained. Consent given by: patient Patient understanding: patient states understanding of the procedure being performed Patient consent: the patient's understanding of the procedure matches consent given Procedure consent: procedure consent matches procedure scheduled Relevant documents: relevant documents present and verified Test results: test results available and properly labeled Imaging studies: imaging studies available Required items: required blood products, implants, devices, and special equipment available Patient identity confirmed: verbally  with patient Preparation: Patient was prepped and draped in the usual sterile fashion. Local anesthesia used: yes Anesthesia: digital block  Anesthesia: Local anesthesia used: yes Local Anesthetic: bupivacaine 0.25% without epinephrine and lidocaine 2% without epinephrine Anesthetic total: 7 mL  Sedation: Patient sedated: no  Patient tolerance: Patient tolerated the procedure well with no immediate complications Comments: Using gentle traction open dislocation of right fifth DIP was reduced without complication, distal cap refill intact  .Marland KitchenLaceration Repair Date/Time: 11/23/2017 1:38 PM Performed by: Eyvonne Mechanic, PA-C Authorized by: Eyvonne Mechanic, PA-C   Consent:    Consent obtained:  Verbal   Consent given by:  Patient   Risks discussed:  Infection   Alternatives discussed:  No treatment Laceration details:    Length (cm):  1.5 Repair type:    Repair type:  Simple Pre-procedure details:    Preparation:  Patient was prepped and draped in usual sterile fashion Exploration:    Hemostasis achieved with:  Direct pressure   Contaminated: no   Treatment:    Area cleansed with:  Saline   Amount of cleaning:  Extensive   Irrigation solution:  Sterile saline Skin repair:    Repair method:  Sutures   Suture size:  5-0   Number of sutures:  3 Approximation:    Approximation:  Close Post-procedure details:    Dressing:  Antibiotic ointment   Patient tolerance of procedure:  Tolerated well, no immediate complications   (including critical care time)  SPLINT APPLICATION Date/Time: 10:19 PM Authorized by: Kelle DartingJeffrey Todd Jamaurie Bernier Consent: Verbal consent obtained. Risks and benefits: risks, benefits and alternatives were discussed Consent given by: patient Splint applied by: nurse Location details: left fifth finger  Splint type: metal  Supplies used: metal Post-procedure: The splinted body part was neurovascularly unchanged following the procedure. Patient tolerance:  Patient tolerated the procedure well with no immediate complications.     Medications Ordered in ED Medications  ampicillin-sulbactam (UNASYN) 1.5 g in sodium chloride 0.9 % 100 mL IVPB (has no administration in time range)  lidocaine (XYLOCAINE) 2 % (with pres) injection 400 mg (400 mg Infiltration Given 10/10/17 2014)  bupivacaine (MARCAINE) 0.5 % (with pres) injection 50 mL (50 mLs Infiltration Given 10/10/17 2014)  morphine 4 MG/ML injection 4 mg (4 mg Intravenous Given 10/10/17 2014)  Ampicillin-Sulbactam (UNASYN) 3 g in sodium chloride 0.9 % 100 mL IVPB (0 g Intravenous Stopped 10/10/17 2115)  Tdap (BOOSTRIX) injection 0.5 mL (0.5 mLs Intramuscular Given 10/10/17 2136)     Initial Impression / Assessment and Plan / ED Course  I have reviewed the triage vital signs and the nursing notes.  Pertinent labs & imaging results that were available during my care of the patient were reviewed by me and considered in my medical decision making (see chart for details).     Final Clinical Impressions(s) / ED Diagnoses   Final diagnoses:  Open dislocation of finger, initial encounter    Labs:   Imaging: DG finger little right  Consults: Dr. Izora Ribasoley   Therapeutics: Unasyn, TDAP, marcaine, xylocaine   Discharge Meds: Keflex  Assessment/Plan: 57 year old male presents today with open dislocation of his right fifth DIP.  Consult placed to on-call surgeon I spoke with Dr. Izora Ribasoley who recommended bedside reduction by either himself or us in the emergency room with copious irrigation.  Patient copiously irrigated using water and Betadine, joint was easily reduced with slight traction.  Wound was again irrigated copiously with sutures placed.  Postreduction films were obtained, splint placed on the finger.  Patient had already received a dose of antibiotics, his tetanus is updated.  Patient will follow-up closely as an outpatient with Dr. Izora Ribasoley he will take antibiotics daily for prophylactic care.   Postreduction films showing successful reduction.  Patient was given morphine here, he will be monitored and then  discharged with close outpatient follow-up.  He is given strict return precautions, he verbalized understanding and agreement to today's plan.    ED Discharge Orders        Ordered    cephALEXin (KEFLEX) 500 MG capsule  3 times daily     10/10/17 2209       Rosalio LoudHedges, Tula Schryver, PA-C 10/10/17 2219    Tilden Fossaees, Elizabeth, MD 10/10/17 2302    Eyvonne MechanicHedges, Aleasha Fregeau, PA-C 11/23/17 1339    Tilden Fossaees, Elizabeth, MD 11/24/17 [id number]

## 2017-10-10 NOTE — ED Notes (Signed)
Main pharmacy called for bupivacaine, tube system down at this time. Will call ED pharmacy

## 2017-10-10 NOTE — ED Triage Notes (Signed)
Pt fell back and attempted to brace fall.  Open fracture noted to R little finger.

## 2017-10-10 NOTE — ED Notes (Signed)
Pt verbalized understanding of all d/c instructions, prescriptions, and f/u information. Opportunity for questioning and answers provided. VSS. All belongings with patient at this time. Pt ambulatory to lobby with steady gait.   

## 2017-10-10 NOTE — ED Notes (Signed)
Romeo AppleBen, ED pharmacist walking to pharmacy now for bupivacaine.

## 2017-10-10 NOTE — ED Notes (Signed)
Trey PaulaJeff, PA suturing patient finger at this time

## 2017-10-12 DIAGNOSIS — S63296A Dislocation of distal interphalangeal joint of right little finger, initial encounter: Secondary | ICD-10-CM | POA: Diagnosis not present

## 2017-11-29 ENCOUNTER — Encounter: Payer: Self-pay | Admitting: Family Medicine

## 2017-11-29 ENCOUNTER — Ambulatory Visit (INDEPENDENT_AMBULATORY_CARE_PROVIDER_SITE_OTHER): Payer: 59 | Admitting: Family Medicine

## 2017-11-29 VITALS — BP 116/80 | HR 69 | Temp 98.1°F | Ht 69.75 in | Wt 189.1 lb

## 2017-11-29 DIAGNOSIS — Z114 Encounter for screening for human immunodeficiency virus [HIV]: Secondary | ICD-10-CM

## 2017-11-29 DIAGNOSIS — Z1331 Encounter for screening for depression: Secondary | ICD-10-CM | POA: Diagnosis not present

## 2017-11-29 DIAGNOSIS — Z125 Encounter for screening for malignant neoplasm of prostate: Secondary | ICD-10-CM | POA: Diagnosis not present

## 2017-11-29 DIAGNOSIS — E785 Hyperlipidemia, unspecified: Secondary | ICD-10-CM | POA: Diagnosis not present

## 2017-11-29 DIAGNOSIS — I10 Essential (primary) hypertension: Secondary | ICD-10-CM | POA: Diagnosis not present

## 2017-11-29 DIAGNOSIS — Z0000 Encounter for general adult medical examination without abnormal findings: Secondary | ICD-10-CM | POA: Diagnosis not present

## 2017-11-29 MED ORDER — PRAVASTATIN SODIUM 40 MG PO TABS
40.0000 mg | ORAL_TABLET | Freq: Every day | ORAL | 1 refills | Status: DC
Start: 2017-11-29 — End: 2018-06-05

## 2017-11-29 MED ORDER — LISINOPRIL 20 MG PO TABS
20.0000 mg | ORAL_TABLET | Freq: Every day | ORAL | 1 refills | Status: DC
Start: 2017-11-29 — End: 2018-08-21

## 2017-11-29 NOTE — Progress Notes (Signed)
HPI:  Using dictation device. Unfortunately this device frequently misinterprets words/phrases.  Here for CPE:  -Concerns and/or follow up today:  Not in for a visit in several years since a physical in 2017.  Chronic medical problems summarized below were reviewed for changes. Reports he has been taking his blood pressure and cholesterol medications daily. Reports diet is ok, no regular exercise the last 8 months.  Unfortunately, mother passed recently. 80yo and passed of colon ca. He was the primary caregiver. Reports doing ok. Not using CPAP for the OSA. Reports was mild and he did not tolerate so specialist advised was ok to try different sleep positions instead. No daytime somnolence. Feels sleeps well.  OSA: -saw pulm -mild per his report and did not tolerate CPAP  Seasonal allergies/shellfish allergy: -chronic -goes to allergist and gets allergy shots -meds: flonase and zyrtec  HTN/mild HLD: -dx a long time ago -meds: lisinopril 12m, pravastatin 40 -denies: CP, SOB, swelling -no regular exercise, reports diet up and down  GERD: -hx of hiatal hernia -take protonix prn a few times per year -sees Dr. SFuller Plan-denies hx stricture, dysphagia  Hx of Acute retinal necrosis: -sees Dr. 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   -Diet: variety of foods, balance and well rounded, larger portion sizes -Exercise: no regular exercise -Diabetes and Dyslipidemia Screening: not fasting -Vaccines: see vaccine section EPIC -wants STI testing (Hep C if born [birth year]: agrees to HIV testing -FH breast, colon or ovarian ca: see FH Last colon cancer screening: 2018 Prostate ca screening: declined DRE, agreeable to PSA  -Alcohol, Tobacco, drug use: see social history  Review of Systems - no fevers, unintentional weight loss, hearing loss, chest pain, sob, hemoptysis, melena, hematochezia, hematuria, genital discharge, changing or concerning skin lesions,  bleeding, bruising, loc, thoughts of self harm or SI  Past Medical History:  Diagnosis Date  . Allergy   . Diverticulosis of colon (without mention of hemorrhage)   . GERD (gastroesophageal reflux disease)   . Hiatal hernia   . Hypertension   . Retinal necrosis, acute, left   . Sleep apnea    "mild" per pt./does not use CPAP  . Spinal stenosis    saw [facility] in 2015 s/p epidural inj    Past Surgical History:  Procedure Laterality Date  . CATARACT EXTRACTION    . COLONOSCOPY  2008   w/[facility]  . RETINAL LASER PROCEDURE      Family History  Problem Relation Age of Onset  . Hypertension Mother   . Kidney cancer Mother   . Hypertension Father   . Hyperlipidemia Father   . Colon cancer Neg Hx     Social History   Socioeconomic History  . Marital status: Married    Spouse name: Not on file  . Number of children: Not on file  . Years of education: Not on file  . Highest education level: Not on file  Occupational History  . Occupation: PChemical engineer UNEMPLOYED  Social Needs  . Financial resource strain: Not on file  . Food insecurity:    Worry: Not on file    Inability: Not on file  . Transportation needs:    Medical: Not on file    Non-medical: Not on file  Tobacco Use  . Smoking status: Never Smoker  . Smokeless tobacco: Never Used  Substance and Sexual Activity  . Alcohol use: Yes    Comment: occasional  . Drug  use: No  . Sexual activity: Not on file  Lifestyle  . Physical activity:    Days per week: Not on file    Minutes per session: Not on file  . Stress: Not on file  Relationships  . Social connections:    Talks on phone: Not on file    Gets together: Not on file    Attends religious service: Not on file    Active member of club or organization: Not on file    Attends meetings of clubs or organizations: Not on file    Relationship status: Not on file  Other Topics Concern  . Not on file  Social History Narrative   Work or  School: retired, Insurance underwriter Situation: lives with wife      Spiritual Beliefs: Baptist      Lifestyle: no regular; diet is healthy        Current Outpatient Medications:  .  famciclovir (FAMVIR) 500 MG tablet, Take 500 mg by mouth daily. , Disp: , Rfl:  .  ketorolac (ACULAR) 0.5 % ophthalmic solution, Place 1 drop into the left eye 2 (two) times daily., Disp: , Rfl:  .  lisinopril (PRINIVIL,ZESTRIL) 20 MG tablet, Take 1 tablet (20 mg total) by mouth daily., Disp: 90 tablet, Rfl: 1 .  loteprednol (LOTEMAX) 0.5 % ophthalmic suspension, Place 1 drop into the left eye daily. , Disp: , Rfl:  .  pravastatin (PRAVACHOL) 40 MG tablet, Take 1 tablet (40 mg total) by mouth daily., Disp: 90 tablet, Rfl: 1  Current Facility-Administered Medications:  .  0.9 %  sodium chloride infusion, 500 mL, Intravenous, Continuous, Ladene Artist, MD  EXAM:  Vitals:   11/29/17 1618  BP: 116/80  Pulse: 69  Temp: 98.1 F (36.7 C)    GENERAL: vitals reviewed and listed below, alert, oriented, appears well hydrated and in no acute distress  HEENT: head atraumatic, PERRLA, normal appearance of eyes, ears, nose and mouth. moist mucus membranes.  NECK: supple, no masses or lymphadenopathy  LUNGS: clear to auscultation bilaterally, no rales, rhonchi or wheeze  CV: HRRR, no peripheral edema or cyanosis, normal pedal pulses  ABDOMEN: bowel sounds normal, soft, non tender to palpation, no masses, no rebound or guarding  GU: declined  SKIN: no rash or abnormal lesions  MS: normal gait, moves all extremities normally  NEURO: normal gait, speech and thought processing grossly intact, muscle tone grossly intact throughout  PSYCH: normal affect, pleasant and cooperative  ASSESSMENT AND PLAN:  Discussed the following assessment and plan:  PREVENTIVE EXAM: -Discussed and advised all [place] preventive services health task force level A and B recommendations for age, sex and risks. -Advised  at least 150 minutes of exercise per week and a healthy diet -labs, studies and vaccines per orders this encounter - Hemoglobin A1c -he has discussed shingles vaccine with his specialist  2. Screening for depression -see phq9  3. Prostate cancer screening - PSA  4. Hyperlipidemia, unspecified hyperlipidemia type - HDL cholesterol - Cholesterol, total  5. Essential hypertension - Basic metabolic panel - CBC  6. Screening for HIV (human immunodeficiency virus) - HIV antibody   Patient advised to return to clinic immediately if symptoms worsen or persist or new concerns.  Patient Instructions  BEFORE YOU LEAVE: -labs -follow up: 4 months  We have ordered labs or studies at this visit. It can take up to 1-2 weeks for results and processing. IF results require follow up or  explanation, we will call you with instructions. Clinically stable results will be released to your [facility]. If you have not heard from [place] or cannot find your results in [facility] in 2 weeks please contact our office at [phone].  If you are not yet signed up for [facility], please consider signing up.   Preventive Care 40-64 Years, Male Preventive care refers to lifestyle choices and visits with your health care provider that can promote health and wellness. What does preventive care include?  A yearly physical exam. This is also called an annual well check.  Dental exams once or twice a year.  Routine eye exams. Ask your health care provider how often you should have your eyes checked.  Personal lifestyle choices, including: ? Daily care of your teeth and gums. ? Regular physical activity. ? Eating a healthy diet. ? Avoiding tobacco and drug use. ? Limiting alcohol use. ? Practicing safe sex. What happens during an annual well check? The services and screenings done by your health care provider during your annual well check will depend on your age, overall health, lifestyle risk factors, and family  history of disease. Counseling Your health care provider may ask you questions about your:  Alcohol use.  Tobacco use.  Drug use.  Emotional well-being.  Home and relationship well-being.  Sexual activity.  Eating habits.  Work and work Statistician.  Screening You may have the following tests or measurements:  Height, weight, and BMI.  Blood pressure.  Lipid and cholesterol levels. These may be checked every 5 years, or more frequently if you are over 46 years old.  Skin check.  Lung cancer screening. You may have this screening every year starting at age 37 if you have a 30-pack-year history of smoking and currently smoke or have quit within the past 15 years.  Fecal occult blood test (FOBT) of the stool. You may have this test every year starting at age 55.  Flexible sigmoidoscopy or colonoscopy. You may have a sigmoidoscopy every 5 years or a colonoscopy every 10 years starting at age 69.  Prostate cancer screening. Recommendations will vary depending on your family history and other risks.  Hepatitis C blood test.  Hepatitis B blood test.  Sexually transmitted disease (STD) testing.  Diabetes screening. This is done by checking your blood sugar (glucose) after you have not eaten for a while (fasting). You may have this done every 1-3 years.  Discuss your test results, treatment options, and if necessary, the need for more tests with your health care provider. Vaccines Your health care provider may recommend certain vaccines, such as:  Influenza vaccine. This is recommended every year.  Tetanus, diphtheria, and acellular pertussis (Tdap, Td) vaccine. You may need a Td booster every 10 years.  Varicella vaccine. You may need this if you have not been vaccinated.  Zoster vaccine. You may need this after age 43.  Measles, mumps, and rubella (MMR) vaccine. You may need at least one dose of MMR if you were born in 1957 or later. You may also need a second  dose.  Pneumococcal 13-valent conjugate (PCV13) vaccine. You may need this if you have certain conditions and have not been vaccinated.  Pneumococcal polysaccharide (PPSV23) vaccine. You may need one or two doses if you smoke cigarettes or if you have certain conditions.  Meningococcal vaccine. You may need this if you have certain conditions.  Hepatitis A vaccine. You may need this if you have certain conditions or if you travel or work  in places where you may be exposed to hepatitis A.  Hepatitis B vaccine. You may need this if you have certain conditions or if you travel or work in places where you may be exposed to hepatitis B.  Haemophilus influenzae type b (Hib) vaccine. You may need this if you have certain risk factors.  Talk to your health care provider about which screenings and vaccines you need and how often you need them. This information is not intended to replace advice given to you by your health care provider. Make sure you discuss any questions you have with your health care provider. Document Released: 07/18/2015 Document Revised: 03/10/2016 Document Reviewed: 04/22/2015 Elsevier Interactive Patient Education  2018 [employer].          No follow-ups on file.  Lucretia Kern, DO

## 2017-11-29 NOTE — Patient Instructions (Signed)
BEFORE YOU LEAVE: -labs -follow up: 4 months  We have ordered labs or studies at this visit. It can take up to 1-2 weeks for results and processing. IF results require follow up or explanation, we will call you with instructions. Clinically stable results will be released to your [facility]. If you have not heard from [place] or cannot find your results in [facility] in 2 weeks please contact our office at [phone].  If you are not yet signed up for [facility], please consider signing up.   Preventive Care 40-64 Years, Male Preventive care refers to lifestyle choices and visits with your health care provider that can promote health and wellness. What does preventive care include?  A yearly physical exam. This is also called an annual well check.  Dental exams once or twice a year.  Routine eye exams. Ask your health care provider how often you should have your eyes checked.  Personal lifestyle choices, including: ? Daily care of your teeth and gums. ? Regular physical activity. ? Eating a healthy diet. ? Avoiding tobacco and drug use. ? Limiting alcohol use. ? Practicing safe sex. What happens during an annual well check? The services and screenings done by your health care provider during your annual well check will depend on your age, overall health, lifestyle risk factors, and family history of disease. Counseling Your health care provider may ask you questions about your:  Alcohol use.  Tobacco use.  Drug use.  Emotional well-being.  Home and relationship well-being.  Sexual activity.  Eating habits.  Work and work Statistician.  Screening You may have the following tests or measurements:  Height, weight, and BMI.  Blood pressure.  Lipid and cholesterol levels. These may be checked every 5 years, or more frequently if you are over 62 years old.  Skin check.  Lung cancer screening. You may have this screening every year starting at age 74 if you have a 30-pack-year  history of smoking and currently smoke or have quit within the past 15 years.  Fecal occult blood test (FOBT) of the stool. You may have this test every year starting at age 40.  Flexible sigmoidoscopy or colonoscopy. You may have a sigmoidoscopy every 5 years or a colonoscopy every 10 years starting at age 84.  Prostate cancer screening. Recommendations will vary depending on your family history and other risks.  Hepatitis C blood test.  Hepatitis B blood test.  Sexually transmitted disease (STD) testing.  Diabetes screening. This is done by checking your blood sugar (glucose) after you have not eaten for a while (fasting). You may have this done every 1-3 years.  Discuss your test results, treatment options, and if necessary, the need for more tests with your health care provider. Vaccines Your health care provider may recommend certain vaccines, such as:  Influenza vaccine. This is recommended every year.  Tetanus, diphtheria, and acellular pertussis (Tdap, Td) vaccine. You may need a Td booster every 10 years.  Varicella vaccine. You may need this if you have not been vaccinated.  Zoster vaccine. You may need this after age 66.  Measles, mumps, and rubella (MMR) vaccine. You may need at least one dose of MMR if you were born in 1957 or later. You may also need a second dose.  Pneumococcal 13-valent conjugate (PCV13) vaccine. You may need this if you have certain conditions and have not been vaccinated.  Pneumococcal polysaccharide (PPSV23) vaccine. You may need one or two doses if you smoke cigarettes or if you  have certain conditions.  Meningococcal vaccine. You may need this if you have certain conditions.  Hepatitis A vaccine. You may need this if you have certain conditions or if you travel or work in places where you may be exposed to hepatitis A.  Hepatitis B vaccine. You may need this if you have certain conditions or if you travel or work in places where you may be  exposed to hepatitis B.  Haemophilus influenzae type b (Hib) vaccine. You may need this if you have certain risk factors.  Talk to your health care provider about which screenings and vaccines you need and how often you need them. This information is not intended to replace advice given to you by your health care provider. Make sure you discuss any questions you have with your health care provider. Document Released: 07/18/2015 Document Revised: 03/10/2016 Document Reviewed: 04/22/2015 Elsevier Interactive Patient Education  [employer].

## 2017-11-30 LAB — CBC
HCT: 46 % (ref 39.0–52.0)
Hemoglobin: 15.5 g/dL (ref 13.0–17.0)
MCHC: 33.6 g/dL (ref 30.0–36.0)
MCV: 94.1 fl (ref 78.0–100.0)
Platelets: 219 10*3/uL (ref 150.0–400.0)
RBC: 4.89 Mil/uL (ref 4.22–5.81)
RDW: 13.4 % (ref 11.5–15.5)
WBC: 5.9 10*3/uL (ref 4.0–10.5)

## 2017-11-30 LAB — BASIC METABOLIC PANEL
BUN: 15 mg/dL (ref 6–23)
CO2: 25 mEq/L (ref 19–32)
Calcium: 9.8 mg/dL (ref 8.4–10.5)
Chloride: 106 mEq/L (ref 96–112)
Creatinine, Ser: 1.37 mg/dL (ref 0.40–1.50)
GFR: 68.5 mL/min (ref >60.00)
Glucose, Bld: 91 mg/dL (ref 70–99)
Potassium: 4.1 mEq/L (ref 3.5–5.1)
Sodium: 140 mEq/L (ref 135–145)

## 2017-11-30 LAB — HIV ANTIBODY (ROUTINE TESTING W REFLEX): HIV 1&2 Ab, 4th Generation: NONREACTIVE

## 2017-11-30 LAB — CHOLESTEROL, TOTAL: Cholesterol: 137 mg/dL (ref 0–200)

## 2017-11-30 LAB — HDL CHOLESTEROL: HDL: 35.8 mg/dL — ABNORMAL LOW (ref >39.00)

## 2017-11-30 LAB — PSA: PSA: 0.78 ng/mL (ref 0.10–4.00)

## 2017-11-30 LAB — HEMOGLOBIN A1C: Hgb A1c MFr Bld: 5.5 % (ref 4.6–6.5)

## 2017-12-17 ENCOUNTER — Encounter (HOSPITAL_COMMUNITY): Payer: Self-pay | Admitting: Emergency Medicine

## 2017-12-17 ENCOUNTER — Other Ambulatory Visit: Payer: Self-pay

## 2017-12-17 ENCOUNTER — Emergency Department (HOSPITAL_COMMUNITY)
Admission: EM | Admit: 2017-12-17 | Discharge: 2017-12-17 | Disposition: A | Discharge status: Home / Self Care | Payer: 59 | Attending: Emergency Medicine | Admitting: Emergency Medicine

## 2017-12-17 ENCOUNTER — Emergency Department (HOSPITAL_COMMUNITY): Payer: 59

## 2017-12-17 DIAGNOSIS — I10 Essential (primary) hypertension: Secondary | ICD-10-CM | POA: Diagnosis not present

## 2017-12-17 DIAGNOSIS — N200 Calculus of kidney: Secondary | ICD-10-CM | POA: Diagnosis not present

## 2017-12-17 DIAGNOSIS — R1032 Left lower quadrant pain: Secondary | ICD-10-CM | POA: Diagnosis not present

## 2017-12-17 DIAGNOSIS — N202 Calculus of kidney with calculus of ureter: Secondary | ICD-10-CM | POA: Diagnosis not present

## 2017-12-17 LAB — COMPREHENSIVE METABOLIC PANEL
ALT: 27 U/L (ref 17–63)
AST: 26 U/L (ref 15–41)
Albumin: 4 g/dL (ref 3.5–5.0)
Alkaline Phosphatase: 69 U/L (ref 38–126)
Anion gap: 8 (ref 5–15)
BUN: 14 mg/dL (ref 6–20)
CO2: 24 mmol/L (ref 22–32)
Calcium: 9.5 mg/dL (ref 8.9–10.3)
Chloride: 110 mmol/L (ref 101–111)
Creatinine, Ser: 1.84 mg/dL — ABNORMAL HIGH (ref 0.61–1.24)
GFR calc Af Amer: 45 mL/min — ABNORMAL LOW (ref >60)
GFR calc non Af Amer: 39 mL/min — ABNORMAL LOW (ref >60)
Glucose, Bld: 106 mg/dL — ABNORMAL HIGH (ref 65–99)
Potassium: 4.6 mmol/L (ref 3.5–5.1)
Sodium: 142 mmol/L (ref 135–145)
Total Bilirubin: 0.7 mg/dL (ref 0.3–1.2)
Total Protein: 6.8 g/dL (ref 6.5–8.1)

## 2017-12-17 LAB — CBC
HCT: 47.7 % (ref 39.0–52.0)
Hemoglobin: 16.3 g/dL (ref 13.0–17.0)
MCH: 30.5 pg (ref 26.0–34.0)
MCHC: 34.2 g/dL (ref 30.0–36.0)
MCV: 89.3 fL (ref 78.0–100.0)
Platelets: 217 10*3/uL (ref 150–400)
RBC: 5.34 MIL/uL (ref 4.22–5.81)
RDW: 12.8 % (ref 11.5–15.5)
WBC: 8.4 10*3/uL (ref 4.0–10.5)

## 2017-12-17 LAB — URINALYSIS, ROUTINE W REFLEX MICROSCOPIC
Bilirubin Urine: NEGATIVE
Glucose, UA: NEGATIVE mg/dL
Hgb urine dipstick: NEGATIVE
Ketones, ur: 5 mg/dL — AB
Leukocytes, UA: NEGATIVE
Nitrite: NEGATIVE
Protein, ur: NEGATIVE mg/dL
Specific Gravity, Urine: 1.025 (ref 1.005–1.030)
pH: 5 (ref 5.0–8.0)

## 2017-12-17 LAB — LIPASE, BLOOD: Lipase: 27 U/L (ref 11–51)

## 2017-12-17 MED ORDER — HYDROCODONE-ACETAMINOPHEN 5-325 MG PO TABS: 1.0000 | ORAL_TABLET | Freq: Four times a day (QID) | ORAL | 0 refills | Status: DC | PRN

## 2017-12-17 MED ORDER — MORPHINE SULFATE (PF) 4 MG/ML IV SOLN
4.0000 mg | Freq: Once | INTRAVENOUS | Status: AC
  Administered 2017-12-17: 4 mg via INTRAVENOUS
  Filled 2017-12-17: qty 1

## 2017-12-17 MED ORDER — IOHEXOL 300 MG/ML  SOLN
100.0000 mL | Freq: Once | INTRAMUSCULAR | Status: AC | PRN
  Administered 2017-12-17: 100 mL via INTRAVENOUS

## 2017-12-17 MED ORDER — ONDANSETRON 4 MG PO TBDP: 4.0000 mg | ORAL_TABLET | Freq: Three times a day (TID) | ORAL | 0 refills | Status: DC | PRN

## 2017-12-17 MED ORDER — NAPROXEN 375 MG PO TABS: 375.0000 mg | ORAL_TABLET | Freq: Two times a day (BID) | ORAL | 0 refills | Status: DC | PRN

## 2017-12-17 MED ORDER — SODIUM CHLORIDE 0.9 % IV BOLUS
1000.0000 mL | Freq: Once | INTRAVENOUS | Status: AC
  Administered 2017-12-17: 1000 mL via INTRAVENOUS

## 2017-12-17 MED ORDER — ONDANSETRON HCL 4 MG/2ML IJ SOLN
4.0000 mg | Freq: Once | INTRAMUSCULAR | Status: AC
  Administered 2017-12-17: 4 mg via INTRAVENOUS
  Filled 2017-12-17: qty 2

## 2017-12-17 MED ORDER — TAMSULOSIN HCL 0.4 MG PO CAPS: 0.4000 mg | ORAL_CAPSULE | Freq: Every day | ORAL | 0 refills | Status: DC

## 2017-12-17 NOTE — ED Triage Notes (Signed)
Patient complains of sharp left lower abdominal pain that started this morning, states pain is worse during urination. Afebrile in triage. Denies hematuria. Patient alert, oriented, and ambulating independently.

## 2017-12-17 NOTE — ED Provider Notes (Signed)
[facility] EMERGENCY DEPARTMENT Provider Note   CSN: [id number] Arrival date & time: 12/17/17  1420     History   Chief Complaint Chief Complaint  Patient presents with  . Abdominal Pain    HPI Cody Simmons is a 57 y.o. male.  The history is provided by the patient and medical records. No language interpreter was used.  Abdominal Pain   Associated symptoms include dysuria. Pertinent negatives include frequency.   Cody Simmons is a 57 y.o. male  with a PMH as listed below who presents to the Emergency Department complaining of constant sharp left lower quadrant abdominal pain which began this morning an hour or so after waking up.  Pain intensified when urinating.  No other aggravating factors noted.  He took 500 mg of Tylenol this morning which did lessen the pain for an hour or 2, but then pain returned.  No other medications taken prior to arrival for symptoms.  No history of similar.  Patient denies nausea, vomiting, back pain, fever, chills, chest pain or shortness of breath.   Past Medical History:  Diagnosis Date  . Allergy   . Diverticulosis of colon (without mention of hemorrhage)   . GERD (gastroesophageal reflux disease)   . Hiatal hernia   . Hypertension   . Retinal necrosis, acute, left   . Sleep apnea    "mild" per pt./does not use CPAP  . Spinal stenosis    saw [place] [facility] in 2015 s/p epidural inj    Patient Active Problem List   Diagnosis Date Noted  . OSA (obstructive sleep apnea) 03/24/2016  . Hypersomnia 01/14/2016  . GERD (gastroesophageal reflux disease) 12/24/2014  . Erectile disorder, acquired, situational, mild 12/24/2014  . MIXED HYPERLIPIDEMIA 06/07/2007  . Essential hypertension 03/17/2007  . Allergic rhinitis 03/17/2007    Past Surgical History:  Procedure Laterality Date  . CATARACT EXTRACTION    . COLONOSCOPY  2008   w/[facility]  . RETINAL LASER PROCEDURE          Home Medications    Prior to  Admission medications   Medication Sig Start Date End Date Taking? Authorizing Provider  famciclovir (FAMVIR) 500 MG tablet Take 500 mg by mouth daily.     [provider]  HYDROcodone-acetaminophen (NORCO) 5-325 MG tablet Take 1 tablet by mouth every 6 (six) hours as needed for moderate pain. 12/17/17   Tailer Volkert, Chase Picket, PA-C  ketorolac (ACULAR) 0.5 % ophthalmic solution Place 1 drop into the left eye 2 (two) times daily.    [provider]  lisinopril (PRINIVIL,ZESTRIL) 20 MG tablet Take 1 tablet (20 mg total) by mouth daily. 11/29/17   Terressa Koyanagi, DO  loteprednol (LOTEMAX) 0.5 % ophthalmic suspension Place 1 drop into the left eye daily.     [provider]  naproxen (NAPROSYN) 375 MG tablet Take 1 tablet (375 mg total) by mouth 2 (two) times daily as needed. 12/17/17   Salam Chesterfield, Chase Picket, PA-C  ondansetron (ZOFRAN ODT) 4 MG disintegrating tablet Take 1 tablet (4 mg total) by mouth every 8 (eight) hours as needed for nausea or vomiting. 12/17/17   Nigel Wessman, Chase Picket, PA-C  pravastatin (PRAVACHOL) 40 MG tablet Take 1 tablet (40 mg total) by mouth daily. 11/29/17   Terressa Koyanagi, DO  tamsulosin (FLOMAX) 0.4 MG CAPS capsule Take 1 capsule (0.4 mg total) by mouth daily. 12/17/17   Crist Kruszka, Chase Picket, PA-C    Family History Family History  Problem Relation  Age of Onset  . Hypertension Mother   . Kidney cancer Mother   . Hypertension Father   . Hyperlipidemia Father   . Colon cancer Neg Hx     Social History Social History   Tobacco Use  . Smoking status: Never Smoker  . Smokeless tobacco: Never Used  Substance Use Topics  . Alcohol use: Yes    Comment: occasional  . Drug use: No     Allergies   Shellfish allergy   Review of Systems Review of Systems  Gastrointestinal: Positive for abdominal pain.  Genitourinary: Positive for dysuria. Negative for discharge, flank pain, frequency, penile swelling, scrotal swelling, testicular pain and urgency.    All other systems reviewed and are negative.    Physical Exam Updated Vital Signs BP 134/87   Pulse 69   Temp 98.7 F (37.1 C) (Oral)   Resp 16   Ht 5\' 10"  (1.778 m)   Wt 83.9 kg (185 lb)   SpO2 97%   BMI 26.54 kg/m   Physical Exam  Constitutional: He is oriented to person, place, and time. He appears well-developed and well-nourished. No distress.  HENT:  Head: Normocephalic and atraumatic.  Cardiovascular: Normal rate, regular rhythm and normal heart sounds.  No murmur heard. Pulmonary/Chest: Effort normal and breath sounds normal. No respiratory distress.  Abdominal: Soft. Bowel sounds are normal. He exhibits no distension.  Tenderness to palpation of left lower quadrant without rebound or guarding.  No CVA tenderness.  Musculoskeletal: He exhibits no edema.  Neurological: He is alert and oriented to person, place, and time.  Skin: Skin is warm and dry.  Nursing note and vitals reviewed.    ED Treatments / Results  Labs (all labs ordered are listed, but only abnormal results are displayed) Labs Reviewed  COMPREHENSIVE METABOLIC PANEL - Abnormal; Notable for the following components:      Result Value   Glucose, Bld 106 (*)    Creatinine, Ser 1.84 (*)    GFR calc non Af Amer 39 (*)    GFR calc Af Amer 45 (*)    All other components within normal limits  URINALYSIS, ROUTINE W REFLEX MICROSCOPIC - Abnormal; Notable for the following components:   Ketones, ur 5 (*)    All other components within normal limits  LIPASE, BLOOD  CBC    EKG None  Radiology Ct Abdomen Pelvis W Contrast  Result Date: 12/17/2017 CLINICAL DATA:  Left lower quadrant pain. EXAM: CT ABDOMEN AND PELVIS WITH CONTRAST TECHNIQUE: Multidetector CT imaging of the abdomen and pelvis was performed using the standard protocol following bolus administration of intravenous contrast. CONTRAST:  OMNIPAQUE IOHEXOL 300 MG/ML  SOLN COMPARISON:  None FINDINGS: Lower chest: No acute abnormality.  Hepatobiliary: Hepatic steatosis identified. No focal liver abnormalities. Small stones identified within the gallbladder measuring up to 3 mm. No gallbladder wall thickening or pericholecystic fluid. Pancreas: Unremarkable. No pancreatic ductal dilatation or surrounding inflammatory changes. Spleen: Normal in size without focal abnormality. Adrenals/Urinary Tract: Normal adrenal glands. Complex hyperdense lesion within the anterior cortex of the right kidney measures 10 2.5 cm, image 29/3. Asymmetric left-sided perinephric fat stranding and hydronephrosis identified. Hydroureter is noted. Within the left side of urinary bladder there is a 7 mm stone near the UVJ, image 73/3. Stomach/Bowel: Stomach is within normal limits. Appendix appears normal. No evidence of bowel wall thickening, distention, or inflammatory changes. Vascular/Lymphatic: Mild aortic atherosclerosis. No aneurysm. No abdominal adenopathy identified. Reproductive: Prostate is unremarkable. Other: No abdominal wall hernia  or abnormality. No abdominopelvic ascites. Musculoskeletal: Degenerative disc disease identified within the lumbar spine. Most advanced at L5-S1. IMPRESSION: 1. Stone within the urinary bladder near the left UVJ is identified measuring 7 mm. There is associated left-sided hydronephrosis, perinephric fat stranding and hydroureter. 2. Gallstones. 3.  Aortic Atherosclerosis (ICD10-I70.0). 4. Complex right kidney lesion is identified. This may represent a hyperdense cyst or solid enhancing neoplasm. Cannot rule out solid enhancing neoplasm. Recommend further evaluation with nonemergent contrast enhanced renal MRI. Electronically Signed   By: Signa Kellaylor  Stroud M.D.   On: 12/17/2017 17:39    Procedures Procedures (including critical care time)  Medications Ordered in ED Medications  sodium chloride 0.9 % bolus 1,000 mL (has no administration in time range)  morphine 4 MG/ML injection 4 mg (4 mg Intravenous Given 12/17/17 1534)    iohexol (OMNIPAQUE) 300 MG/ML solution 100 mL (100 mLs Intravenous Contrast Given 12/17/17 1702)     Initial Impression / Assessment and Plan / ED Course  I have reviewed the triage vital signs and the nursing notes.  Pertinent labs & imaging results that were available during my care of the patient were reviewed by me and considered in my medical decision making (see chart for details).    Malachi Paradiseherron J Clift is a 57 y.o. male who presents to ED for sharp LLQ abdominal pain which began today. On exam, patient is afebrile, hemodynamically stable with focal tenderness to the LLQ. No rebound or guarding appreciated. Did have worsening pain with urination. UA without signs of infection. No blood in UA either making stone less likely, but still possible. CBC with normal white count. Bump in creatinine to 1.84. Hydrated in ED. CT obtained showing 7 mm stone near left UVJ with associated hydro.  Complex right kidney lesion also noted. Will have patient follow up with urology. He understands importance of urology follow up as well reasons to return to ER. He is tolerating PO and pain is controlled. Home care instructions discussed and all questions answered.   Final Clinical Impressions(s) / ED Diagnoses   Final diagnoses:  Nephrolithiasis    ED Discharge Orders        Ordered    naproxen (NAPROSYN) 375 MG tablet  2 times daily PRN     12/17/17 1803    HYDROcodone-acetaminophen (NORCO) 5-325 MG tablet  Every 6 hours PRN     12/17/17 1803    ondansetron (ZOFRAN ODT) 4 MG disintegrating tablet  Every 8 hours PRN     12/17/17 1803    tamsulosin (FLOMAX) 0.4 MG CAPS capsule  Daily     12/17/17 1803       Nelson Noone, Chase PicketJaime Pilcher, PA-C 12/17/17 1810    Terrilee FilesButler, Michael C, MD 12/19/17 1130

## 2017-12-17 NOTE — Discharge Instructions (Signed)
It was my pleasure taking care of you today!   You have been diagnosed with a kidney stone. Drink plenty of fluids to help you pass the stone.  Take naproxen as directed with food for mild to moderate pain. Use your pain medication as directed and only as needed for severe pain. Taking flomax as directed will also help to pass the stone. Use Zofran for nausea as directed.  Follow up with the urology clinic listed in regards to your hospital visit. You will need to call Monday morning to schedule a follow up appointment.   Return to the ED immediately if you develop fever that persists > 101, uncontrolled pain or vomiting, or other concerns.   Do not drink alcohol, drive or participate in any other potentially dangerous activities while taking opiate pain medication as it may make you sleepy. Do not take this medication with any other sedating medications, either prescription or over-the-counter. If you were prescribed Percocet or Vicodin, do not take these with acetaminophen (Tylenol) as it is already contained within these medications.   This medication is an opiate (or narcotic) pain medication and can be habit forming.  Use it as little as possible to achieve adequate pain control.  Do not use or use it with extreme caution if you have a history of opiate abuse or dependence. This medication is intended for your use only - do not give any to anyone else and keep it in a secure place where nobody else, especially children, have access to it. It will also cause or worsen constipation, so you may want to consider taking an over-the-counter stool softener while you are taking this medication.

## 2017-12-20 DIAGNOSIS — N201 Calculus of ureter: Secondary | ICD-10-CM | POA: Diagnosis not present

## 2018-01-03 DIAGNOSIS — Z961 Presence of intraocular lens: Secondary | ICD-10-CM | POA: Diagnosis not present

## 2018-01-03 DIAGNOSIS — H3589 Other specified retinal disorders: Secondary | ICD-10-CM | POA: Diagnosis not present

## 2018-01-03 DIAGNOSIS — H2511 Age-related nuclear cataract, right eye: Secondary | ICD-10-CM | POA: Diagnosis not present

## 2018-01-05 ENCOUNTER — Other Ambulatory Visit: Payer: Self-pay | Admitting: Family Medicine

## 2018-01-15 ENCOUNTER — Other Ambulatory Visit: Payer: Self-pay | Admitting: Family Medicine

## 2018-02-20 DIAGNOSIS — J3089 Other allergic rhinitis: Secondary | ICD-10-CM | POA: Diagnosis not present

## 2018-02-20 DIAGNOSIS — J301 Allergic rhinitis due to pollen: Secondary | ICD-10-CM | POA: Diagnosis not present

## 2018-02-20 DIAGNOSIS — Z91013 Allergy to seafood: Secondary | ICD-10-CM | POA: Diagnosis not present

## 2018-02-20 DIAGNOSIS — J069 Acute upper respiratory infection, unspecified: Secondary | ICD-10-CM | POA: Diagnosis not present

## 2018-03-29 DIAGNOSIS — J301 Allergic rhinitis due to pollen: Secondary | ICD-10-CM | POA: Diagnosis not present

## 2018-03-29 DIAGNOSIS — J3089 Other allergic rhinitis: Secondary | ICD-10-CM | POA: Diagnosis not present

## 2018-04-04 ENCOUNTER — Ambulatory Visit: Payer: 59 | Admitting: Family Medicine

## 2018-04-04 ENCOUNTER — Encounter: Payer: Self-pay | Admitting: Family Medicine

## 2018-04-04 VITALS — BP 102/72 | HR 84 | Temp 98.9°F | Ht 70.0 in | Wt 190.0 lb

## 2018-04-04 DIAGNOSIS — Z23 Encounter for immunization: Secondary | ICD-10-CM

## 2018-04-04 DIAGNOSIS — R202 Paresthesia of skin: Secondary | ICD-10-CM

## 2018-04-04 DIAGNOSIS — J301 Allergic rhinitis due to pollen: Secondary | ICD-10-CM | POA: Diagnosis not present

## 2018-04-04 DIAGNOSIS — E538 Deficiency of other specified B group vitamins: Secondary | ICD-10-CM

## 2018-04-04 DIAGNOSIS — E782 Mixed hyperlipidemia: Secondary | ICD-10-CM

## 2018-04-04 DIAGNOSIS — J3089 Other allergic rhinitis: Secondary | ICD-10-CM | POA: Diagnosis not present

## 2018-04-04 DIAGNOSIS — M5441 Lumbago with sciatica, right side: Secondary | ICD-10-CM

## 2018-04-04 DIAGNOSIS — M5442 Lumbago with sciatica, left side: Secondary | ICD-10-CM | POA: Diagnosis not present

## 2018-04-04 DIAGNOSIS — G8929 Other chronic pain: Secondary | ICD-10-CM

## 2018-04-04 DIAGNOSIS — I10 Essential (primary) hypertension: Secondary | ICD-10-CM

## 2018-04-04 LAB — BASIC METABOLIC PANEL
BUN: 17 mg/dL (ref 6–23)
CO2: 25 mEq/L (ref 19–32)
Calcium: 9.8 mg/dL (ref 8.4–10.5)
Chloride: 105 mEq/L (ref 96–112)
Creatinine, Ser: 1.37 mg/dL (ref 0.40–1.50)
GFR: 68.42 mL/min (ref >60.00)
Glucose, Bld: 97 mg/dL (ref 70–99)
Potassium: 4.3 mEq/L (ref 3.5–5.1)
Sodium: 138 mEq/L (ref 135–145)

## 2018-04-04 LAB — VITAMIN B12: Vitamin B-12: 192 pg/mL — ABNORMAL LOW (ref 211–911)

## 2018-04-04 LAB — TSH: TSH: 0.79 u[IU]/mL (ref 0.35–4.50)

## 2018-04-04 NOTE — Progress Notes (Signed)
HPI:  Using dictation device. Unfortunately this device frequently misinterprets words/phrases.  Cody Simmons is a pleasant 57 y.o. here for follow up. Chronic medical problems summarized below were reviewed for changes and stability and were updated as needed below. These issues and their treatment remain stable for the most part.  Unfortunately, he had a kidney stone a few months ago was treated in the emergency room.  Doing well since.  Also, he has had a flare in his low back issues.  Reports this is chronic for many years, he sees Cody Simmons about this and plans to get back in with him.  Basically has had low back pain for about 6 months and sometimes some radiation to both legs anterior aspect of his upper legs.  Denies weakness, numbness, bowel or bladder dysfunction, weight loss or fevers or malaise.  He also gets some tingling in his thumbs and index fingers.  This is been going on for years.  Unchanged. Denies CP, SOB, DOE, treatment intolerance or new symptoms. Due for flu shot and labs.  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  Past Medical History:  Diagnosis Date  . Allergy   . Diverticulosis of colon (without mention of hemorrhage)   . GERD (gastroesophageal reflux disease)   . Hiatal hernia   . Hypertension   . Nephrolithiasis   . Retinal necrosis, acute, left   . Sleep apnea    "mild" per pt./does  not use CPAP  . Spinal stenosis    saw [place] [facility] in 2015 s/p epidural inj    Past Surgical History:  Procedure Laterality Date  . CATARACT EXTRACTION    . COLONOSCOPY  2008   w/[facility]  . RETINAL LASER PROCEDURE      Family History  Problem Relation Age of Onset  . Hypertension Mother   . Kidney cancer Mother   . Hypertension Father   . Hyperlipidemia Father   . Colon cancer Neg Hx     SOCIAL HX: see hpi   Current Outpatient Medications:  .  famciclovir (FAMVIR) 500 MG tablet, Take 500 mg by mouth daily. , Disp: , Rfl:  .  ketorolac (ACULAR) 0.5 % ophthalmic solution, Place 1 drop into the left eye 2 (two) times daily., Disp: , Rfl:  .  lisinopril (PRINIVIL,ZESTRIL) 20 MG tablet, Take 1 tablet (20 mg total) by mouth daily., Disp: 90 tablet, Rfl: 1 .  loteprednol (LOTEMAX) 0.5 % ophthalmic suspension, Place 1 drop into the left eye daily. , Disp: , Rfl:  .  pravastatin (PRAVACHOL) 40 MG tablet, Take 1 tablet (40 mg total) by mouth daily., Disp: 90 tablet, Rfl: 1  Current Facility-Administered Medications:  .  0.9 %  sodium chloride infusion, 500 mL, Intravenous, Continuous, Cody Dare, MD  EXAM:  Vitals:   04/04/18 1433  BP: 102/72  Pulse: 84  Temp: 98.9 F (37.2 C)    Body mass index is 27.26 kg/m.  GENERAL: vitals reviewed and listed above, alert, oriented, appears well hydrated and in no acute distress  HEENT: atraumatic, conjunttiva clear, no obvious abnormalities on inspection of external nose and ears  NECK: no obvious masses on inspection  LUNGS: clear to auscultation bilaterally, no wheezes, rales or rhonchi, good air movement  CV: HRRR, no peripheral edema  MS: moves all extremities without noticeable abnormality, gait normal, Normal Gait Normal inspection of back, neck, upper and lower extremities no sig abnormalities, no obvious extensive scoliosis or leg length descrepancy No bony TTP Soft tissue TTP at:no sig findings -/+ tests: neg  trendelenburg,-facet loading, -SLRT, -CLRT, -FABER, -FADIR, tinels, phalens Normal muscle strength, sensation to light touch and DTRs in LEs/UEs bilaterally   PSYCH: pleasant and cooperative, no obvious depression or anxiety  ASSESSMENT AND PLAN:  Discussed the following assessment and plan:  Essential hypertension -labs per orders, cont current meds  Mixed hyperlipidemia -lfiestyle recs, not doing as well currently with exercise  Chronic bilateral low back pain with bilateral sciatica Paresthesias -no focal deficits on exam or sig positive tests -we discussed possible serious and likely etiologies, workup and treatment, treatment risks and return precautions -after this discussion, Cody Simmons opted for: -he plans to follow up with Cody Simmons - he plans to call -agrees to labs per orders -declined treatment here, further eval here or OMM or PT for now -Patient advised to return or notify a doctor immediately if symptoms worsen or persist or new concerns arise.  Patient Instructions  BEFORE YOU LEAVE: -flu shot -labs -follow up: 3 months, sooner as needed  Call Cody Simmons about the back and finger issues. Let us know if we can assist further.  We have ordered labs or studies at this visit. It can take up to 1-2 weeks for results and processing. IF results require follow up or explanation, we will call you with instructions. Clinically stable results will be released to your [facility]. If you have not heard from [place] or cannot find your results in [facility] in 2 weeks please contact our office at [phone].  If you are not yet signed up for [facility], please consider signing up.   We recommend the following healthy lifestyle for LIFE: 1) Small portions. But, make sure to get regular (at least 3 per day), healthy meals and small healthy snacks if needed.  2) Eat a healthy clean diet.   TRY TO EAT: -at least 5-7 servings of low sugar, colorful, and nutrient rich vegetables per day (not  corn, potatoes or bananas.) -berries are the best choice if you wish to eat fruit (only eat small amounts if trying to reduce weight)  -lean meets (fish, white meat of chicken or [place]) -vegan proteins for some meals - beans or tofu, whole grains, nuts and seeds -Replace bad fats with good fats - good fats include: fish, nuts and seeds, canola oil, olive oil -small amounts of low fat or non fat dairy -small amounts of100 % whole grains - check the lables -drink plenty of water  AVOID: -SUGAR, sweets, anything with added sugar, corn syrup or sweeteners - must read labels as even foods advertised as "healthy" often are loaded with sugar -if you must have a sweetener, small amounts of stevia may be best -sweetened beverages and artificially sweetened beverages -simple starches (rice, bread, potatoes, pasta, chips, etc - small amounts of 100% whole grains are ok) -red meat, pork, butter -fried foods, fast food, processed food, excessive dairy, eggs and coconut.  3)Get at least  150 minutes of sweaty aerobic exercise per week.  4)Reduce stress - consider counseling, meditation and relaxation to balance other aspects of your life.          Terressa KoyanagiHannah R Foye Haggart, DO

## 2018-04-04 NOTE — Addendum Note (Signed)
Addended by: Johnella Moloney on: 04/04/2018 03:10 PM   Modules accepted: Orders

## 2018-04-04 NOTE — Patient Instructions (Signed)
BEFORE YOU LEAVE: -flu shot -labs -follow up: 3 months, sooner as needed  Call Dr. Ethelene Hal about the back and finger issues. Let us know if we can assist further.  We have ordered labs or studies at this visit. It can take up to 1-2 weeks for results and processing. IF results require follow up or explanation, we will call you with instructions. Clinically stable results will be released to your [facility]. If you have not heard from [place] or cannot find your results in [facility] in 2 weeks please contact our office at [phone].  If you are not yet signed up for [facility], please consider signing up.   We recommend the following healthy lifestyle for LIFE: 1) Small portions. But, make sure to get regular (at least 3 per day), healthy meals and small healthy snacks if needed.  2) Eat a healthy clean diet.   TRY TO EAT: -at least 5-7 servings of low sugar, colorful, and nutrient rich vegetables per day (not corn, potatoes or bananas.) -berries are the best choice if you wish to eat fruit (only eat small amounts if trying to reduce weight)  -lean meets (fish, white meat of chicken or [place]) -vegan proteins for some meals - beans or tofu, whole grains, nuts and seeds -Replace bad fats with good fats - good fats include: fish, nuts and seeds, canola oil, olive oil -small amounts of low fat or non fat dairy -small amounts of100 % whole grains - check the lables -drink plenty of water  AVOID: -SUGAR, sweets, anything with added sugar, corn syrup or sweeteners - must read labels as even foods advertised as "healthy" often are loaded with sugar -if you must have a sweetener, small amounts of stevia may be best -sweetened beverages and artificially sweetened beverages -simple starches (rice, bread, potatoes, pasta, chips, etc - small amounts of 100% whole grains are ok) -red meat, pork, butter -fried foods, fast food, processed food, excessive dairy, eggs and coconut.  3)Get at least 150 minutes of  sweaty aerobic exercise per week.  4)Reduce stress - consider counseling, meditation and relaxation to balance other aspects of your life.

## 2018-04-05 DIAGNOSIS — M5416 Radiculopathy, lumbar region: Secondary | ICD-10-CM | POA: Diagnosis not present

## 2018-04-05 DIAGNOSIS — M545 Low back pain: Secondary | ICD-10-CM | POA: Diagnosis not present

## 2018-04-06 NOTE — Addendum Note (Signed)
Addended by: Johnella Moloney on: 04/06/2018 03:39 PM   Modules accepted: Orders

## 2018-04-10 ENCOUNTER — Ambulatory Visit (INDEPENDENT_AMBULATORY_CARE_PROVIDER_SITE_OTHER): Payer: 59

## 2018-04-10 DIAGNOSIS — E538 Deficiency of other specified B group vitamins: Secondary | ICD-10-CM

## 2018-04-10 MED ORDER — CYANOCOBALAMIN 1000 MCG/ML IJ SOLN
1000.0000 ug | Freq: Once | INTRAMUSCULAR | Status: AC
  Administered 2018-04-10: 1000 ug via INTRAMUSCULAR

## 2018-04-10 NOTE — Progress Notes (Signed)
Per orders of Dr. Kim, injection of B12 given by Nereyda Bowler. Patient tolerated injection well.  

## 2018-04-17 ENCOUNTER — Ambulatory Visit (INDEPENDENT_AMBULATORY_CARE_PROVIDER_SITE_OTHER): Payer: 59 | Admitting: *Deleted

## 2018-04-17 DIAGNOSIS — E538 Deficiency of other specified B group vitamins: Secondary | ICD-10-CM | POA: Diagnosis not present

## 2018-04-17 MED ORDER — CYANOCOBALAMIN 1000 MCG/ML IJ SOLN
1000.0000 ug | Freq: Once | INTRAMUSCULAR | Status: AC
  Administered 2018-04-17: 1000 ug via INTRAMUSCULAR

## 2018-04-17 NOTE — Progress Notes (Signed)
Per orders of Dr. Kim, injection of Vit B12 given by GREEN, ASHTYN M. Patient tolerated injection well.  

## 2018-04-22 DIAGNOSIS — M5136 Other intervertebral disc degeneration, lumbar region: Secondary | ICD-10-CM | POA: Diagnosis not present

## 2018-04-24 ENCOUNTER — Ambulatory Visit: Payer: 59 | Admitting: Family Medicine

## 2018-04-24 DIAGNOSIS — E538 Deficiency of other specified B group vitamins: Secondary | ICD-10-CM

## 2018-04-24 MED ORDER — CYANOCOBALAMIN 1000 MCG/ML IJ SOLN
1000.0000 ug | Freq: Once | INTRAMUSCULAR | Status: AC
  Administered 2018-04-24: 1000 ug via INTRAMUSCULAR

## 2018-05-01 ENCOUNTER — Ambulatory Visit (INDEPENDENT_AMBULATORY_CARE_PROVIDER_SITE_OTHER): Payer: 59

## 2018-05-01 DIAGNOSIS — E538 Deficiency of other specified B group vitamins: Secondary | ICD-10-CM | POA: Diagnosis not present

## 2018-05-01 MED ORDER — CYANOCOBALAMIN 1000 MCG/ML IJ SOLN
1000.0000 ug | Freq: Once | INTRAMUSCULAR | Status: AC
  Administered 2018-05-01: 1000 ug via INTRAMUSCULAR

## 2018-05-01 NOTE — Progress Notes (Signed)
Per orders of Dr. Kim, injection of B12 given by Kendra R Johnson. Patient tolerated injection well. 

## 2018-05-08 ENCOUNTER — Other Ambulatory Visit (INDEPENDENT_AMBULATORY_CARE_PROVIDER_SITE_OTHER): Payer: 59

## 2018-05-08 DIAGNOSIS — E538 Deficiency of other specified B group vitamins: Secondary | ICD-10-CM

## 2018-05-08 LAB — VITAMIN B12: Vitamin B-12: 781 pg/mL (ref 211–911)

## 2018-05-22 DIAGNOSIS — M5136 Other intervertebral disc degeneration, lumbar region: Secondary | ICD-10-CM | POA: Diagnosis not present

## 2018-05-22 DIAGNOSIS — M545 Low back pain: Secondary | ICD-10-CM | POA: Diagnosis not present

## 2018-05-31 DIAGNOSIS — M5136 Other intervertebral disc degeneration, lumbar region: Secondary | ICD-10-CM | POA: Diagnosis not present

## 2018-06-05 ENCOUNTER — Other Ambulatory Visit: Payer: Self-pay | Admitting: Family Medicine

## 2018-06-08 DIAGNOSIS — M48061 Spinal stenosis, lumbar region without neurogenic claudication: Secondary | ICD-10-CM | POA: Diagnosis not present

## 2018-06-08 DIAGNOSIS — M5136 Other intervertebral disc degeneration, lumbar region: Secondary | ICD-10-CM | POA: Diagnosis not present

## 2018-06-08 DIAGNOSIS — R899 Unspecified abnormal finding in specimens from other organs, systems and tissues: Secondary | ICD-10-CM | POA: Diagnosis not present

## 2018-07-11 DIAGNOSIS — M48061 Spinal stenosis, lumbar region without neurogenic claudication: Secondary | ICD-10-CM | POA: Diagnosis not present

## 2018-07-11 DIAGNOSIS — M5136 Other intervertebral disc degeneration, lumbar region: Secondary | ICD-10-CM | POA: Diagnosis not present

## 2018-07-19 ENCOUNTER — Other Ambulatory Visit (INDEPENDENT_AMBULATORY_CARE_PROVIDER_SITE_OTHER): Payer: 59

## 2018-07-19 DIAGNOSIS — E538 Deficiency of other specified B group vitamins: Secondary | ICD-10-CM

## 2018-07-19 LAB — VITAMIN B12: Vitamin B-12: 1003 pg/mL — ABNORMAL HIGH (ref 211–911)

## 2018-07-24 DIAGNOSIS — J019 Acute sinusitis, unspecified: Secondary | ICD-10-CM | POA: Diagnosis not present

## 2018-07-24 DIAGNOSIS — Z91013 Allergy to seafood: Secondary | ICD-10-CM | POA: Diagnosis not present

## 2018-07-24 DIAGNOSIS — J3089 Other allergic rhinitis: Secondary | ICD-10-CM | POA: Diagnosis not present

## 2018-07-24 DIAGNOSIS — J301 Allergic rhinitis due to pollen: Secondary | ICD-10-CM | POA: Diagnosis not present

## 2018-08-14 DIAGNOSIS — H353122 Nonexudative age-related macular degeneration, left eye, intermediate dry stage: Secondary | ICD-10-CM | POA: Diagnosis not present

## 2018-08-14 DIAGNOSIS — H35352 Cystoid macular degeneration, left eye: Secondary | ICD-10-CM | POA: Diagnosis not present

## 2018-08-14 DIAGNOSIS — H30142 Acute posterior multifocal placoid pigment epitheliopathy, left eye: Secondary | ICD-10-CM | POA: Diagnosis not present

## 2018-08-19 ENCOUNTER — Other Ambulatory Visit: Payer: Self-pay | Admitting: Family Medicine

## 2018-11-08 ENCOUNTER — Encounter: Payer: Self-pay | Admitting: Family Medicine

## 2018-11-08 ENCOUNTER — Ambulatory Visit (INDEPENDENT_AMBULATORY_CARE_PROVIDER_SITE_OTHER): Payer: 59 | Admitting: Family Medicine

## 2018-11-08 ENCOUNTER — Other Ambulatory Visit: Payer: Self-pay

## 2018-11-08 DIAGNOSIS — G4733 Obstructive sleep apnea (adult) (pediatric): Secondary | ICD-10-CM | POA: Diagnosis not present

## 2018-11-08 DIAGNOSIS — K219 Gastro-esophageal reflux disease without esophagitis: Secondary | ICD-10-CM

## 2018-11-08 DIAGNOSIS — I10 Essential (primary) hypertension: Secondary | ICD-10-CM | POA: Diagnosis not present

## 2018-11-08 DIAGNOSIS — J301 Allergic rhinitis due to pollen: Secondary | ICD-10-CM

## 2018-11-08 DIAGNOSIS — E782 Mixed hyperlipidemia: Secondary | ICD-10-CM

## 2018-11-08 DIAGNOSIS — E538 Deficiency of other specified B group vitamins: Secondary | ICD-10-CM

## 2018-11-08 NOTE — Progress Notes (Addendum)
Virtual Visit via Video Note  I connected with Cody Simmons   on 11/08/18 at  2:00 PM EDT by a video enabled telemedicine application and verified that I am speaking with the correct person using two identifiers.  Location patient: home Location provider:work office Persons participating in the virtual visit: patient, provider  I discussed the limitations of evaluation and management by telemedicine and the availability of in person appointments. The patient expressed understanding and agreed to proceed.   Cody Simmons DOB: Sep 29, 1960 Encounter date: 11/08/2018  This is a 58 y.o. male who presents to establish care. Chief Complaint  Patient presents with  . Establish Care    transfer from Dr Selena Batten    History of present illness: Had discussed tingling in fingers previously with HK. Didn't note improvement with B12. Thumb, forefinger both hands and some of middle finger. More numb. No pain in fingers, wrists. Pretty constant. Has been going on for over a year. Not getting worse or better. Feels pretty equal. No weakness. No numbness in feet. On daily basis; just bothers him if can't feel what he is supposed to be feeling. Noted scratching hands when doing some work previously. Does see chiropractor q 3-4 months to keep things straightened out. States no change in hand appearance.   OSA: saw pulm, mild and didn't tolerate CPAP. Does feel pretty well after sleep. Occsaionally night he is not sleeping well.   Seasonal allergies: gets allergy shots every 2 weeks (Dr. Gary Fleet). Antihistamine just occasionally if needed.   HTN: lisinopril 20mg . Not checking blood pressure at home.   HL: pravastatin 40mg . No problems with medication.   GERD: not really using Protonix; no longer having issues with acid reflux/heartburn. Watches diet and eats early. follows with Dr. Russella Dar.. EGD 2008 hiatal hernia. Last colonoscopy was 03/2017.   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  Back pain: follows with Dr. Ethelene Hal. Follows up for injections as needed. Making him more functional; pain is still there.   Past Medical History:  Diagnosis Date  . Allergy   . Diverticulosis of colon (without mention of hemorrhage)   . GERD (gastroesophageal reflux disease)   . Hiatal hernia   . Hypertension   . Nephrolithiasis   . Retinal necrosis, acute, left   . Sleep apnea    "mild" per pt./does not use CPAP  . Spinal stenosis    saw [facility] [facility] in 2015 s/p epidural inj   Past Surgical History:  Procedure Laterality Date  . CATARACT EXTRACTION    . COLONOSCOPY  [id number]   w/[facility]  . RETINAL LASER PROCEDURE     Allergies  Allergen Reactions  . Shellfish Allergy Anaphylaxis   Current Meds  Medication Sig  . famciclovir (FAMVIR) 500 MG tablet Take 500 mg by mouth daily.   Marland Kitchen ketorolac (ACULAR) 0.5 % ophthalmic solution Place 1 drop into the left eye 2 (two) times daily.  Marland Kitchen lisinopril (PRINIVIL,ZESTRIL) 20 MG tablet TAKE 1 TABLET BY MOUTH  DAILY  . loteprednol (LOTEMAX) 0.5 % ophthalmic suspension Place 1 drop into the left eye daily.   . pravastatin (PRAVACHOL) 40 MG tablet TAKE 1 TABLET BY MOUTH  DAILY   Current Facility-Administered Medications for the 11/08/18 encounter (Office Visit) with Wynn Banker, MD  Medication  . 0.9 %  sodium chloride infusion   Social History   Tobacco Use  . Smoking status: Never Smoker  .  Smokeless tobacco: Never Used  Substance Use Topics  . Alcohol use: Yes    Comment: occasional   Family History  Problem Relation Age of Onset  . Hypertension Mother   . Kidney cancer Mother   . Colon cancer Mother [id number]  . Stroke Mother        came after cancer dx and thought associated  . Hypertension Father   . Hyperlipidemia Father   . Obesity Brother   . High blood pressure Maternal Grandmother   . High blood pressure Maternal Grandfather   . Diabetes Maternal Grandfather   . Glaucoma  Other      Review of Systems  Constitutional: Negative for chills, fatigue and fever.  Respiratory: Negative for cough, chest tightness, shortness of breath and wheezing.   Cardiovascular: Negative for chest pain, palpitations and leg swelling.    Objective:  There were no vitals taken for this visit.      BP Readings from Last 3 Encounters:  04/04/18 102/72  12/17/17 104/82  11/29/17 116/80   Wt Readings from Last 3 Encounters:  04/04/18 190 lb (86.2 kg)  12/17/17 185 lb (83.9 kg)  11/29/17 189 lb 1.6 oz (85.8 kg)    EXAM:  GENERAL: alert, oriented, appears well and in no acute distress  HEENT: atraumatic, conjunctiva clear, no obvious abnormalities on inspection of external nose and ears  NECK: normal movements of the head and neck  LUNGS: on inspection no signs of respiratory distress, breathing rate appears normal, no obvious gross SOB, gasping or wheezing  CV: no obvious cyanosis  MS: moves all visible extremities without noticeable abnormality  PSYCH/NEURO: pleasant and cooperative, no obvious depression or anxiety, speech and thought processing grossly intact   Assessment/Plan  1. Essential hypertension Stable. Continue current medication.  - CBC with Differential/Platelet; Future - Comprehensive metabolic panel; Future  2. Allergic rhinitis due to pollen, unspecified seasonality Stable; follows with allergy.  3. OSA (obstructive sleep apnea) Mild per patient; did not tolerate CPAP.  4. Gastroesophageal reflux disease, esophagitis presence not specified Stable. No longer taking medication.  5. Mixed hyperlipidemia On pravasatin. - Lipid panel; Future  6. Vitamin B12 deficiency Will recheck baseline. - Vitamin B12; Future - Folate; Future  Return for physical exam in 1-2 mo.    I discussed the assessment and treatment plan with the patient. The patient was provided an opportunity to ask questions and all were answered. The patient agreed  with the plan and demonstrated an understanding of the instructions.   The patient was advised to call back or seek an in-person evaluation if the symptoms worsen or if the condition fails to improve as anticipated.  I provided 25 minutes of non-face-to-face time during this encounter.   Theodis ShoveJunell Tory Septer, MD

## 2018-11-13 ENCOUNTER — Other Ambulatory Visit: Payer: Self-pay | Admitting: Family Medicine

## 2018-11-13 ENCOUNTER — Encounter: Payer: Self-pay | Admitting: Family Medicine

## 2018-11-15 ENCOUNTER — Other Ambulatory Visit: Payer: Self-pay | Admitting: Family Medicine

## 2018-11-15 DIAGNOSIS — R059 Cough, unspecified: Secondary | ICD-10-CM

## 2018-11-15 DIAGNOSIS — R05 Cough: Secondary | ICD-10-CM

## 2018-11-22 ENCOUNTER — Other Ambulatory Visit: Payer: Self-pay | Admitting: Family Medicine

## 2018-12-25 ENCOUNTER — Encounter: Payer: Self-pay | Admitting: Family Medicine

## 2018-12-25 ENCOUNTER — Other Ambulatory Visit: Payer: Self-pay

## 2018-12-25 ENCOUNTER — Ambulatory Visit (INDEPENDENT_AMBULATORY_CARE_PROVIDER_SITE_OTHER): Payer: 59 | Admitting: Family Medicine

## 2018-12-25 VITALS — BP 102/68 | HR 64 | Temp 98.1°F | Ht 70.0 in | Wt 192.2 lb

## 2018-12-25 DIAGNOSIS — E782 Mixed hyperlipidemia: Secondary | ICD-10-CM | POA: Diagnosis not present

## 2018-12-25 DIAGNOSIS — M20011 Mallet finger of right finger(s): Secondary | ICD-10-CM

## 2018-12-25 DIAGNOSIS — E538 Deficiency of other specified B group vitamins: Secondary | ICD-10-CM

## 2018-12-25 DIAGNOSIS — I10 Essential (primary) hypertension: Secondary | ICD-10-CM

## 2018-12-25 DIAGNOSIS — R05 Cough: Secondary | ICD-10-CM | POA: Diagnosis not present

## 2018-12-25 DIAGNOSIS — G5610 Other lesions of median nerve, unspecified upper limb: Secondary | ICD-10-CM

## 2018-12-25 DIAGNOSIS — Z0000 Encounter for general adult medical examination without abnormal findings: Secondary | ICD-10-CM | POA: Diagnosis not present

## 2018-12-25 DIAGNOSIS — R059 Cough, unspecified: Secondary | ICD-10-CM

## 2018-12-25 LAB — CBC WITH DIFFERENTIAL/PLATELET
Basophils Absolute: 0 10*3/uL (ref 0.0–0.1)
Basophils Relative: 0.8 % (ref 0.0–3.0)
Eosinophils Absolute: 0.1 10*3/uL (ref 0.0–0.7)
Eosinophils Relative: 1.3 % (ref 0.0–5.0)
HCT: 48.8 % (ref 39.0–52.0)
Hemoglobin: 16.7 g/dL (ref 13.0–17.0)
Lymphocytes Relative: 49.4 % — ABNORMAL HIGH (ref 12.0–46.0)
Lymphs Abs: 2.2 10*3/uL (ref 0.7–4.0)
MCHC: 34.2 g/dL (ref 30.0–36.0)
MCV: 92.9 fl (ref 78.0–100.0)
Monocytes Absolute: 0.4 10*3/uL (ref 0.1–1.0)
Monocytes Relative: 7.9 % (ref 3.0–12.0)
Neutro Abs: 1.8 10*3/uL (ref 1.4–7.7)
Neutrophils Relative %: 40.6 % — ABNORMAL LOW (ref 43.0–77.0)
Platelets: 199 10*3/uL (ref 150.0–400.0)
RBC: 5.25 Mil/uL (ref 4.22–5.81)
RDW: 13.3 % (ref 11.5–15.5)
WBC: 4.5 10*3/uL (ref 4.0–10.5)

## 2018-12-25 LAB — FOLATE: Folate: 5 ng/mL — ABNORMAL LOW (ref >5.9)

## 2018-12-25 LAB — VITAMIN B12: Vitamin B-12: 719 pg/mL (ref 211–911)

## 2018-12-25 NOTE — Progress Notes (Signed)
Cody Simmons DOB: 02/16/1961 Encounter date: 12/25/2018  This is a 58 y.o. male who presents for complete physical   History of present illness/Additional concerns: Doing well overall. No specific concerns today.  Taking lisinopril; no isuses. Taking pravastatin without issue.   No weakness in hand. Just annoys him due to decreased sensation thumb, second finger and then slightly middle/4th. Constant discomfort. Has been going over a year.    Past Medical History:  Diagnosis Date  . Allergy   . Diverticulosis of colon (without mention of hemorrhage)   . GERD (gastroesophageal reflux disease)   . Hiatal hernia   . Hypertension   . Nephrolithiasis   . Retinal necrosis, acute, left   . Sleep apnea    "mild" per pt./does not use CPAP  . Spinal stenosis    saw [facility] in 2015 s/p epidural inj   Past Surgical History:  Procedure Laterality Date  . CATARACT EXTRACTION    . COLONOSCOPY  [id number]   w/[facility]  . RETINAL LASER PROCEDURE     Allergies  Allergen Reactions  . Shellfish Allergy Anaphylaxis   Current Meds  Medication Sig  . famciclovir (FAMVIR) 500 MG tablet Take 500 mg by mouth daily.   Marland Kitchen. ketorolac (ACULAR) 0.5 % ophthalmic solution Place 1 drop into the left eye 2 (two) times daily.  Marland Kitchen. lisinopril (ZESTRIL) 20 MG tablet TAKE 1 TABLET BY MOUTH  DAILY  . loteprednol (LOTEMAX) 0.5 % ophthalmic suspension Place 1 drop into the left eye daily.   . pravastatin (PRAVACHOL) 40 MG tablet TAKE 1 TABLET BY MOUTH  DAILY  . vitamin B-12 (CYANOCOBALAMIN) 50 MCG tablet Take 50 mcg by mouth daily.   Current Facility-Administered Medications for the 12/25/18 encounter (Office Visit) with Wynn BankerKoberlein, Yavier Snider C, MD  Medication  . 0.9 %  sodium chloride infusion   Social History   Tobacco Use  . Smoking status: Never Smoker  . Smokeless tobacco: Never Used  Substance Use Topics  . Alcohol use: Yes    Comment: occasional   Family History  Problem Relation Age of Onset   . Hypertension Mother   . Kidney cancer Mother   . Colon cancer Mother [id number]  . Stroke Mother        came after cancer dx and thought associated  . Hypertension Father   . Hyperlipidemia Father   . Obesity Brother   . High blood pressure Maternal Grandmother   . High blood pressure Maternal Grandfather   . Diabetes Maternal Grandfather   . Glaucoma Other      Review of Systems  Constitutional: Negative for activity change, appetite change, chills, fatigue, fever and unexpected weight change.  HENT: Negative for congestion, ear pain, hearing loss, sinus pressure, sinus pain, sore throat and trouble swallowing.   Eyes: Negative for pain and visual disturbance.  Respiratory: Negative for cough, chest tightness, shortness of breath and wheezing.   Cardiovascular: Negative for chest pain, palpitations and leg swelling.  Gastrointestinal: Negative for abdominal distention, abdominal pain, blood in stool, constipation, diarrhea, nausea and vomiting.  Genitourinary: Negative for decreased urine volume, difficulty urinating, dysuria, penile pain and testicular pain.  Musculoskeletal: Negative for arthralgias, back pain and joint swelling.  Skin: Negative for rash.  Neurological: Negative for dizziness, weakness, numbness and headaches.  Hematological: Negative for adenopathy. Does not bruise/bleed easily.  Psychiatric/Behavioral: Negative for agitation, sleep disturbance and suicidal ideas. The patient is not nervous/anxious.     CBC:  Lab Results  Component Value Date  WBC 8.4 12/17/2017   HGB 16.3 12/17/2017   HCT 47.7 12/17/2017   MCH 30.5 12/17/2017   MCHC 34.2 12/17/2017   RDW 12.8 12/17/2017   PLT 217 12/17/2017   CMP: Lab Results  Component Value Date   NA 138 04/04/2018   K 4.3 04/04/2018   CL 105 04/04/2018   CO2 25 04/04/2018   ANIONGAP 8 12/17/2017   GLUCOSE 97 04/04/2018   BUN 17 04/04/2018   CREATININE 1.37 04/04/2018   GFRAA 45 (L) 12/17/2017   CALCIUM 9.8  04/04/2018   PROT 6.8 12/17/2017   BILITOT 0.7 12/17/2017   ALKPHOS 69 12/17/2017   ALT 27 12/17/2017   AST 26 12/17/2017   LIPID: Lab Results  Component Value Date   CHOL 137 11/29/2017   TRIG 127.0 04/05/2016   HDL 35.80 (L) 11/29/2017   LDLCALC 79 04/05/2016    Objective:  BP 102/68 (BP Location: Right Arm, Patient Position: Sitting, Cuff Size: Normal)   Pulse 64   Temp 98.1 F (36.7 C) (Oral)   Ht 5\' 10"  (1.778 m)   Wt 192 lb 3.2 oz (87.2 kg)   SpO2 98%   BMI 27.58 kg/m   Weight: 192 lb 3.2 oz (87.2 kg)   BP Readings from Last 3 Encounters:  12/25/18 102/68  04/04/18 102/72  12/17/17 104/82   Wt Readings from Last 3 Encounters:  12/25/18 192 lb 3.2 oz (87.2 kg)  04/04/18 190 lb (86.2 kg)  12/17/17 185 lb (83.9 kg)    Physical Exam Constitutional:      General: He is not in acute distress.    Appearance: He is well-developed.  HENT:     Head: Normocephalic and atraumatic.     Right Ear: External ear normal.     Left Ear: External ear normal.     Nose: Nose normal.     Mouth/Throat:     Pharynx: No oropharyngeal exudate.  Eyes:     Conjunctiva/sclera: Conjunctivae normal.     Pupils: Pupils are equal, round, and reactive to light.  Neck:     Musculoskeletal: Neck supple.     Thyroid: No thyromegaly.  Cardiovascular:     Rate and Rhythm: Normal rate and regular rhythm.     Heart sounds: Normal heart sounds. No murmur. No friction rub. No gallop.   Pulmonary:     Effort: Pulmonary effort is normal. No respiratory distress.     Breath sounds: Normal breath sounds. No stridor. No wheezing or rales.  Abdominal:     General: Bowel sounds are normal.     Palpations: Abdomen is soft.     Hernia: There is no hernia in the left inguinal area or right inguinal area.  Genitourinary:    Pubic Area: No rash.      Penis: Normal.      Scrotum/Testes: Normal.  Musculoskeletal: Normal range of motion.     Comments: 5th digit right hand with no ability to extend  DIP.   Lymphadenopathy:     Lower Body: No right inguinal adenopathy. No left inguinal adenopathy.  Skin:    General: Skin is warm and dry.  Neurological:     Mental Status: He is alert and oriented to person, place, and time.     Motor: No weakness, tremor or abnormal muscle tone.     Gait: Gait is intact.     Deep Tendon Reflexes:     Reflex Scores:      Tricep reflexes are 2+ on the  right side and 2+ on the left side.      Bicep reflexes are 2+ on the right side and 2+ on the left side.      Brachioradialis reflexes are 2+ on the right side and 2+ on the left side.    Comments: Sensation decreased thumb, first finger.   Psychiatric:        Behavior: Behavior normal.        Thought Content: Thought content normal.        Judgment: Judgment normal.     Assessment/Plan: There are no preventive care reminders to display for this patient. Health Maintenance reviewed.  1. Preventative health care Encouraged daily exercise. Continue with healthy eating.  2. Vitamin B12 deficiency  - Folate - Vitamin B12 - Vitamin B12  3. Mixed hyperlipidemia  - Lipid panel  4. Essential hypertension Stable; continue current medication. - Comprehensive metabolic panel - CBC with Differential/Platelet  5. Cough Resolved; previous prolonged cough. - SAR CoV2 Serology (COVID 19)AB(IGG)IA  6. Median nerve neuropathy, unspecified laterality  - Ambulatory referral to Orthopedics  7. Mallet finger of right hand  - Ambulatory referral to Orthopedics  Return pending bloodwork.  Micheline Rough, MD

## 2018-12-26 ENCOUNTER — Encounter: Payer: Self-pay | Admitting: Family Medicine

## 2018-12-26 LAB — COMPREHENSIVE METABOLIC PANEL
ALT: 25 U/L (ref 0–53)
AST: 19 U/L (ref 0–37)
Albumin: 4.5 g/dL (ref 3.5–5.2)
Alkaline Phosphatase: 71 U/L (ref 39–117)
BUN: 13 mg/dL (ref 6–23)
CO2: 23 mEq/L (ref 19–32)
Calcium: 9.4 mg/dL (ref 8.4–10.5)
Chloride: 103 mEq/L (ref 96–112)
Creatinine, Ser: 1.41 mg/dL (ref 0.40–1.50)
GFR: 62.12 mL/min (ref >60.00)
Glucose, Bld: 85 mg/dL (ref 70–99)
Potassium: 4.4 mEq/L (ref 3.5–5.1)
Sodium: 138 mEq/L (ref 135–145)
Total Bilirubin: 0.6 mg/dL (ref 0.2–1.2)
Total Protein: 6.8 g/dL (ref 6.0–8.3)

## 2018-12-26 LAB — LIPID PANEL
Cholesterol: 114 mg/dL (ref 0–200)
HDL: 32.3 mg/dL — ABNORMAL LOW (ref >39.00)
LDL Cholesterol: 46 mg/dL (ref 0–99)
NonHDL: 81.44
Total CHOL/HDL Ratio: 4
Triglycerides: 178 mg/dL — ABNORMAL HIGH (ref 0.0–149.0)
VLDL: 35.6 mg/dL (ref 0.0–40.0)

## 2018-12-26 LAB — SAR COV2 SEROLOGY (COVID19)AB(IGG),IA: SARS CoV2 AB IGG: NEGATIVE

## 2019-01-21 ENCOUNTER — Encounter: Payer: Self-pay | Admitting: Family Medicine

## 2019-01-22 ENCOUNTER — Other Ambulatory Visit: Payer: Self-pay | Admitting: Family Medicine

## 2019-01-22 MED ORDER — FOLIC ACID 400 MCG PO TABS: 400.0000 ug | ORAL_TABLET | Freq: Every day | ORAL | Status: AC

## 2019-02-02 ENCOUNTER — Encounter: Payer: Self-pay | Admitting: Family Medicine

## 2019-02-02 DIAGNOSIS — M20019 Mallet finger of unspecified finger(s): Secondary | ICD-10-CM

## 2019-04-28 ENCOUNTER — Other Ambulatory Visit: Payer: Self-pay | Admitting: Family Medicine

## 2019-05-07 ENCOUNTER — Other Ambulatory Visit: Payer: Self-pay | Admitting: Family Medicine

## 2019-07-09 IMAGING — CT CT ABD-PELV W/ CM
2 of 5 series · 16 of 46 positions shown, 18 images · IV contrast (omnipaque)
Comparison: None

CLINICAL DATA: Left lower quadrant pain.

EXAM:
CT ABDOMEN AND PELVIS WITH CONTRAST
TECHNIQUE: Multidetector CT imaging of the abdomen and pelvis was performed
using the standard protocol following bolus administration of
intravenous contrast.
CONTRAST:  100mL OMNIPAQUE IOHEXOL 300 MG/ML  SOLN

[Series 3: abd/ pelvis 5.0 i30f 2 · axial · 0.76mm/px · z∈[+795,+1200]mm · 13 of 91 slices shown, 15 images]
[im 5/91  soft-tissue]
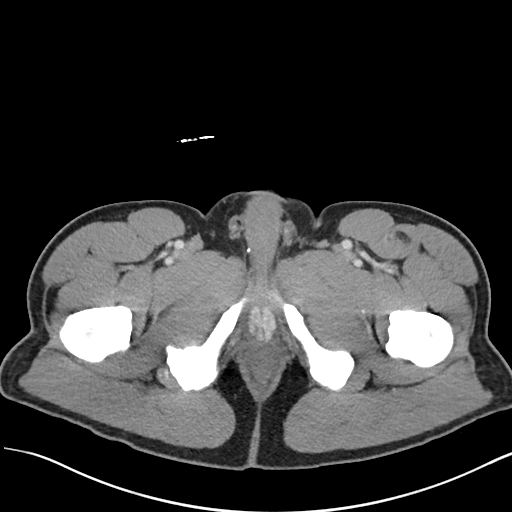
[im 5/91  bone]
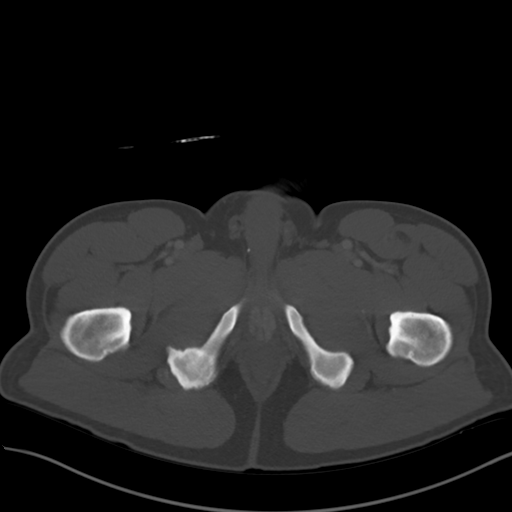
[im 15/91  soft-tissue]
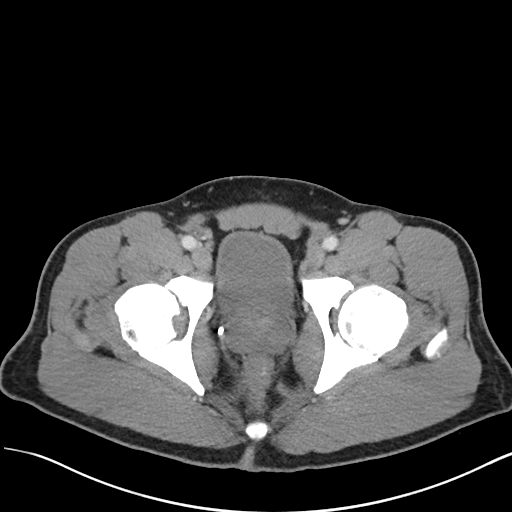
[im 19/91  soft-tissue]
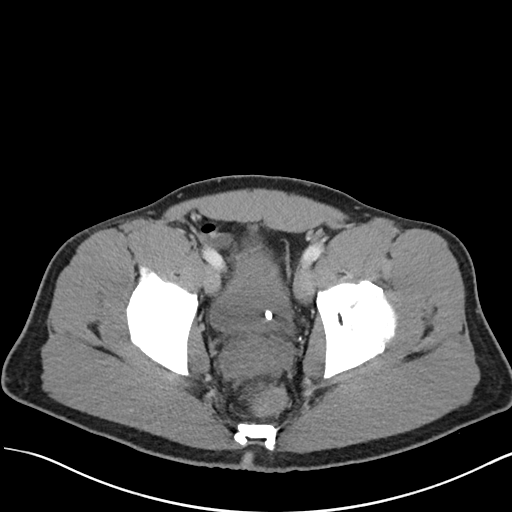
[im 24/91  soft-tissue]
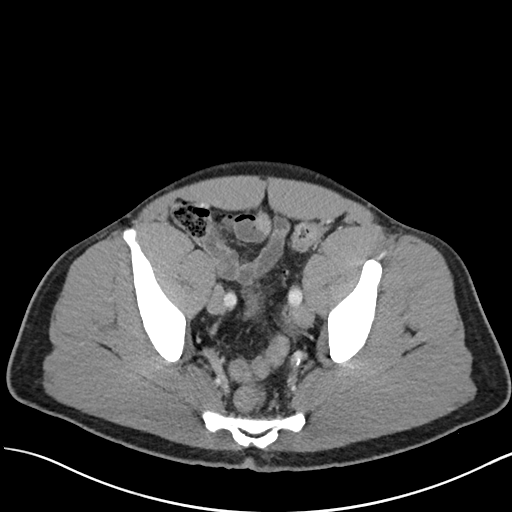
[im 34/91  soft-tissue]
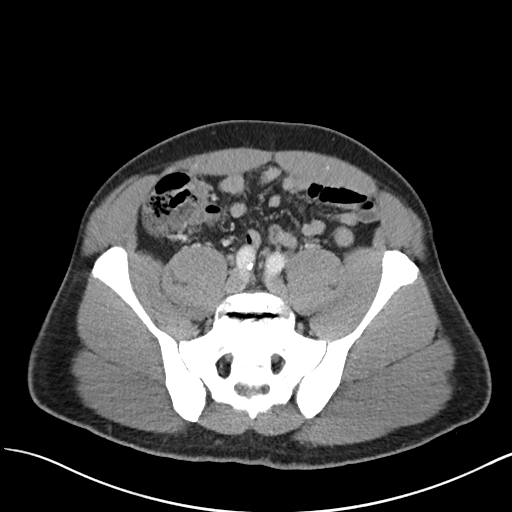
[im 38/91  soft-tissue]
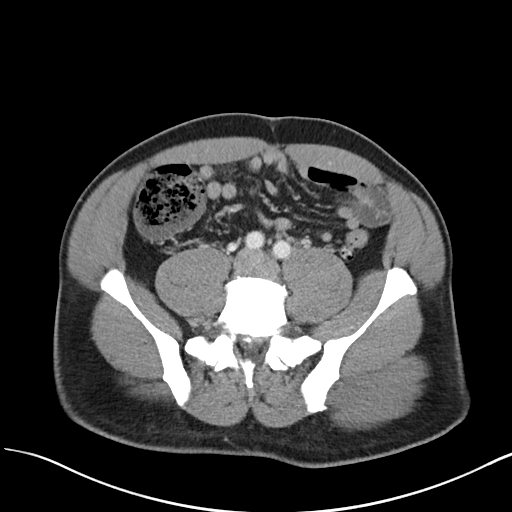
[im 48/91  soft-tissue]
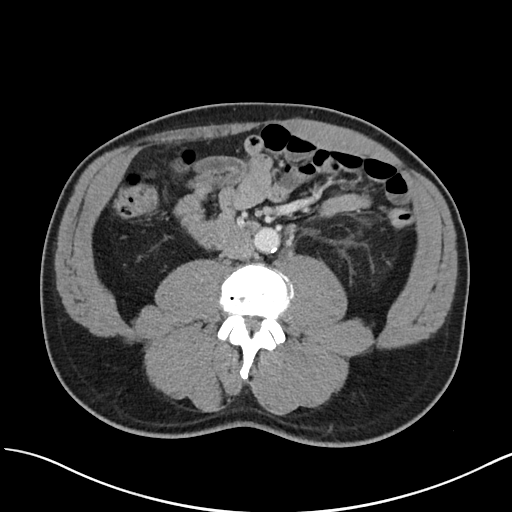
[im 53/91  soft-tissue]
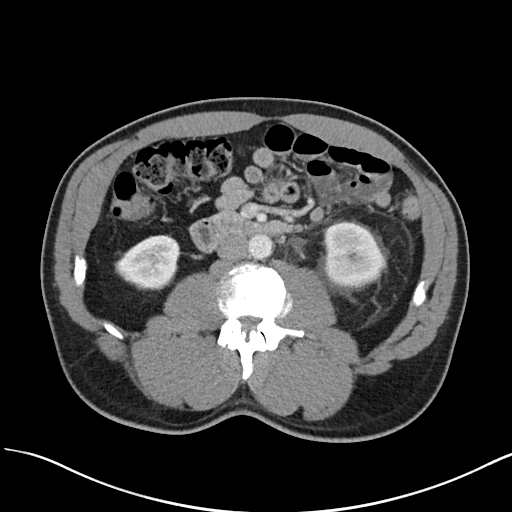
[im 57/91  soft-tissue]
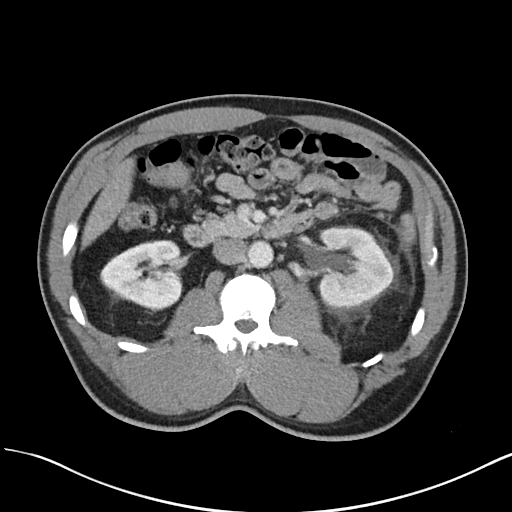
[im 57/91  bone]
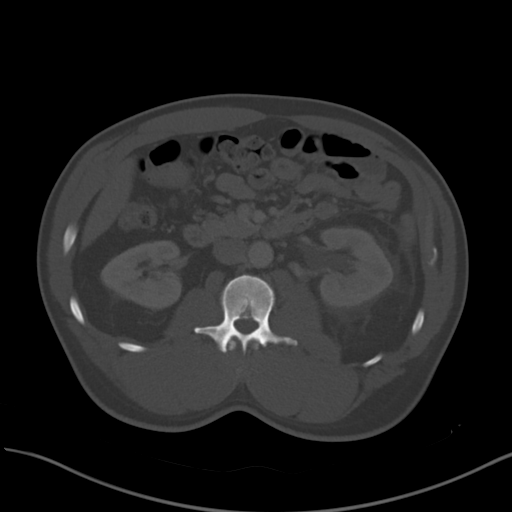
[im 67/91  soft-tissue]
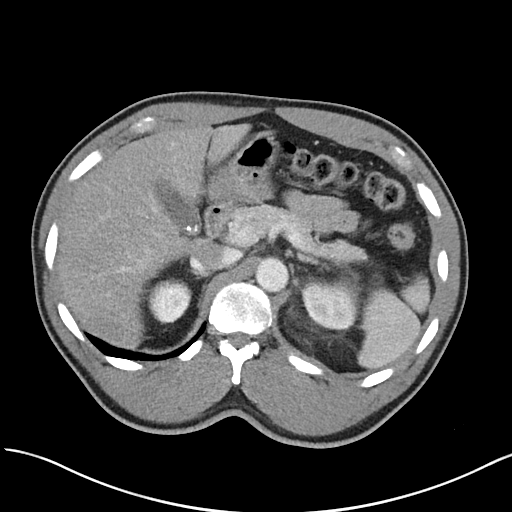
[im 72/91  soft-tissue]
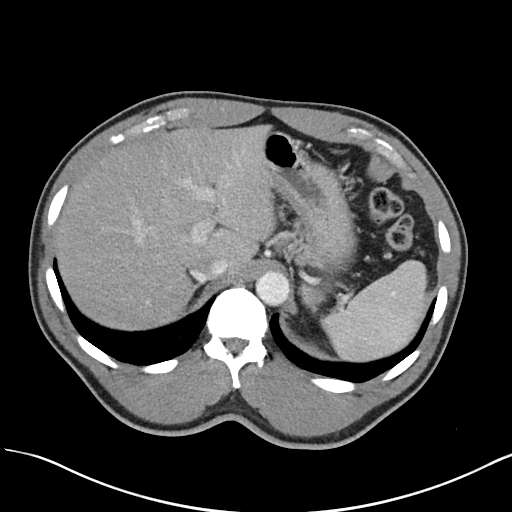
[im 76/91  soft-tissue]
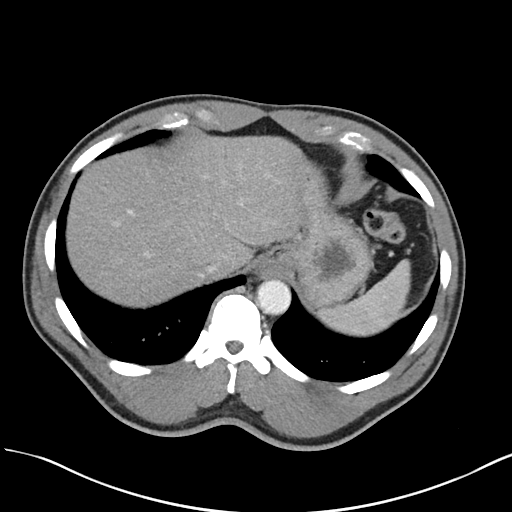
[im 86/91  soft-tissue]
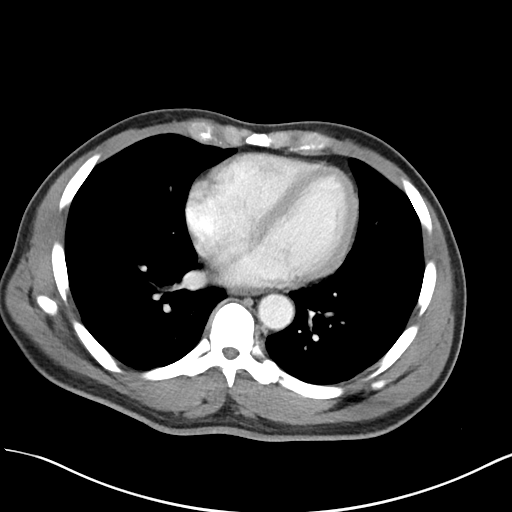

[Series 6: coronal soft tissue · coronal · 0.88mm/px · 3 of 101 slices shown]
[im 34/101  soft-tissue]
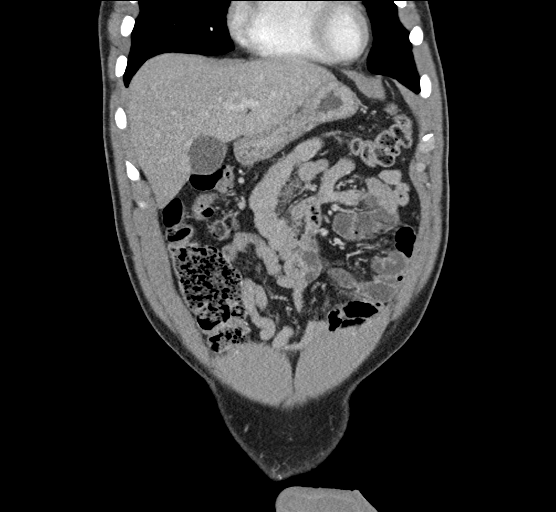
[im 45/101  soft-tissue]
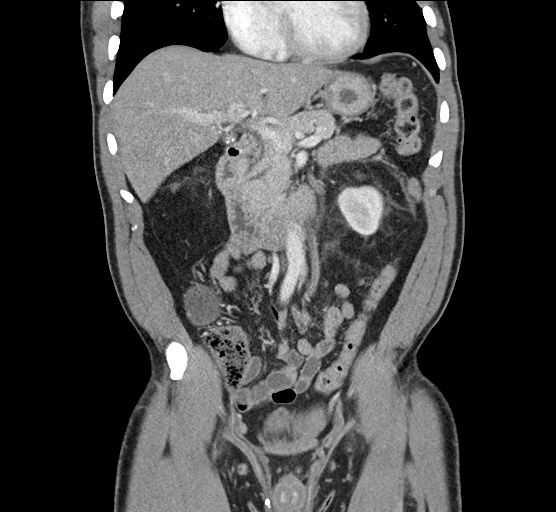
[im 56/101  soft-tissue]
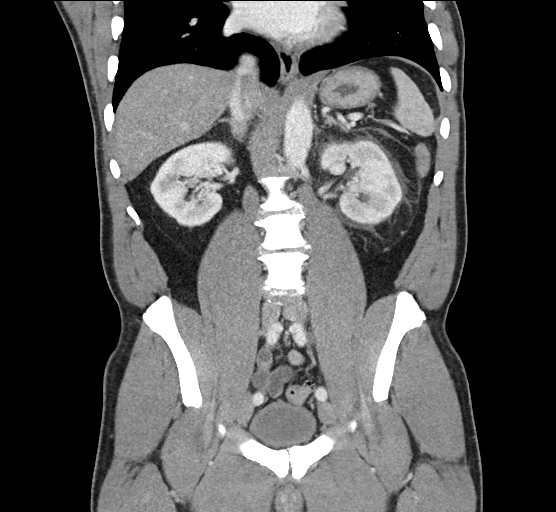

[16 of 46 positions shown; findings below may reference images not displayed]

FINDINGS: Lower chest: No acute abnormality.

Hepatobiliary: Hepatic steatosis identified. No focal liver
abnormalities. Small stones identified within the gallbladder
measuring up to 3 mm. No gallbladder wall thickening or
pericholecystic fluid.

Pancreas: Unremarkable. No pancreatic ductal dilatation or
surrounding inflammatory changes.

Spleen: Normal in size without focal abnormality.

Adrenals/Urinary Tract: Normal adrenal glands.

Complex hyperdense lesion within the anterior cortex of the right
kidney measures 10 2.5 cm, image [DATE]. Asymmetric left-sided
perinephric fat stranding and hydronephrosis identified. Hydroureter
is noted. Within the left side of urinary bladder there is a 7 mm
stone near the UVJ, image 73/3.

Stomach/Bowel: Stomach is within normal limits. Appendix appears
normal. No evidence of bowel wall thickening, distention, or
inflammatory changes.

Vascular/Lymphatic: Mild aortic atherosclerosis. No aneurysm. No
abdominal adenopathy identified.

Reproductive: Prostate is unremarkable.

Other: No abdominal wall hernia or abnormality. No abdominopelvic
ascites.

Musculoskeletal: Degenerative disc disease identified within the
lumbar spine. Most advanced at L5-S1.
IMPRESSION: 1. Stone within the urinary bladder near the left UVJ is identified
measuring 7 mm. There is associated left-sided hydronephrosis,
perinephric fat stranding and hydroureter.
2. Gallstones.
3.  Aortic Atherosclerosis (C87I6-84I.I).
4. Complex right kidney lesion is identified. This may represent a
hyperdense cyst or solid enhancing neoplasm. Cannot rule out solid
enhancing neoplasm. Recommend further evaluation with nonemergent
contrast enhanced renal MRI.

## 2019-10-07 ENCOUNTER — Other Ambulatory Visit: Payer: Self-pay | Admitting: Family Medicine

## 2019-10-16 ENCOUNTER — Other Ambulatory Visit: Payer: Self-pay | Admitting: Family Medicine

## 2019-12-27 ENCOUNTER — Other Ambulatory Visit: Payer: Self-pay | Admitting: Family Medicine

## 2020-01-05 ENCOUNTER — Other Ambulatory Visit: Payer: Self-pay | Admitting: Family Medicine

## 2020-01-22 ENCOUNTER — Other Ambulatory Visit: Payer: Self-pay | Admitting: *Deleted

## 2020-01-22 MED ORDER — PRAVASTATIN SODIUM 40 MG PO TABS: 40.0000 mg | ORAL_TABLET | Freq: Every day | ORAL | 0 refills | Status: DC

## 2020-01-22 NOTE — Telephone Encounter (Signed)
Rx done. 

## 2020-03-16 ENCOUNTER — Other Ambulatory Visit (INDEPENDENT_AMBULATORY_CARE_PROVIDER_SITE_OTHER): Payer: Self-pay | Admitting: Ophthalmology

## 2020-03-18 ENCOUNTER — Other Ambulatory Visit: Payer: Self-pay | Admitting: Family Medicine

## 2020-05-24 ENCOUNTER — Other Ambulatory Visit: Payer: Self-pay | Admitting: Family Medicine

## 2020-05-27 ENCOUNTER — Encounter: Payer: Self-pay | Admitting: Family Medicine

## 2020-05-28 ENCOUNTER — Other Ambulatory Visit: Payer: Self-pay

## 2020-05-28 ENCOUNTER — Encounter: Payer: Self-pay | Admitting: Family Medicine

## 2020-05-28 ENCOUNTER — Ambulatory Visit (INDEPENDENT_AMBULATORY_CARE_PROVIDER_SITE_OTHER): Payer: 59 | Admitting: Family Medicine

## 2020-05-28 VITALS — BP 108/62 | HR 88 | Temp 98.2°F | Ht 70.5 in | Wt 200.1 lb

## 2020-05-28 DIAGNOSIS — I10 Essential (primary) hypertension: Secondary | ICD-10-CM

## 2020-05-28 DIAGNOSIS — Z0000 Encounter for general adult medical examination without abnormal findings: Secondary | ICD-10-CM

## 2020-05-28 DIAGNOSIS — E538 Deficiency of other specified B group vitamins: Secondary | ICD-10-CM

## 2020-05-28 DIAGNOSIS — H259 Unspecified age-related cataract: Secondary | ICD-10-CM

## 2020-05-28 DIAGNOSIS — E782 Mixed hyperlipidemia: Secondary | ICD-10-CM

## 2020-05-28 DIAGNOSIS — H269 Unspecified cataract: Secondary | ICD-10-CM | POA: Insufficient documentation

## 2020-05-28 MED ORDER — PRAVASTATIN SODIUM 40 MG PO TABS: 40.0000 mg | ORAL_TABLET | Freq: Every day | ORAL | 1 refills | Status: DC

## 2020-05-28 NOTE — Patient Instructions (Addendum)
[email or web address] or go to pharmacy websites to set up for COVID booster - I would try to do this in about 2 weeks.   We can do your bloodwork, flu, shingles vaccination in about 6 weeks.

## 2020-05-28 NOTE — Progress Notes (Signed)
Cody Simmons DOB: 08-Oct-1960 Encounter date: 05/28/2020  This is a 59 y.o. male who presents for complete physical   History of present illness/Additional concerns:  Interested in flu shot today. History of chicken pox in eye; would like to get shingles - sees retinal specialist. He did have epidural steroid injection yesterday.   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 This is second epidural injection. Feels good today. Dr. Ethelene Hal did this.   Last visit and physical 12/25/2018. Hypertension: Lisinopril 20 mg daily. Doesn't check at home.  Hyperlipidemia: Pravastatin 40 mg daily; no muscle aches/cramps.   Takes vitamin B12 supplement for B12 deficiency.  500 mcg daily.  Last colonoscopy was 03/2017 by Dr. Russella Dar.  Repeat suggested in 10 years.  Past Medical History:  Diagnosis Date  . Allergy   . Diverticulosis of colon (without mention of hemorrhage)   . GERD (gastroesophageal reflux disease)   . Hiatal hernia   . Hypertension   . Nephrolithiasis   . Retinal necrosis, acute, left   . Sleep apnea    "mild" per pt./does not use CPAP  . Spinal stenosis    saw [place] [facility] in 2015 s/p epidural inj   Past Surgical History:  Procedure Laterality Date  . CATARACT EXTRACTION    . COLONOSCOPY  2119,4174   w/[place]  . RETINAL LASER PROCEDURE     Allergies  Allergen Reactions  . Shellfish Allergy Anaphylaxis   Current Meds  Medication Sig  . famciclovir (FAMVIR) 500 MG tablet TAKE 1 TABLET BY MOUTH  DAILY  . folic acid (FOLATE) 400 MCG tablet Take 1 tablet (400 mcg total) by mouth daily.  Marland Kitchen ketorolac (ACULAR) 0.5 % ophthalmic solution Place 1 drop into the left eye 2 (two) times daily.  Marland Kitchen lisinopril (ZESTRIL) 20 MG tablet TAKE 1 TABLET BY MOUTH  DAILY  . loteprednol (LOTEMAX) 0.5 % ophthalmic suspension Place 1 drop into the left eye daily.   . pravastatin (PRAVACHOL) 40 MG tablet Take 1 tablet (40 mg total) by mouth daily.  . vitamin B-12 (CYANOCOBALAMIN) 500 MCG tablet  Take 500 mcg by mouth daily.  . [DISCONTINUED] pravastatin (PRAVACHOL) 40 MG tablet TAKE 1 TABLET BY MOUTH  DAILY   Current Facility-Administered Medications for the 05/28/20 encounter (Office Visit) with Wynn Banker, MD  Medication  . 0.9 %  sodium chloride infusion   Social History   Tobacco Use  . Smoking status: Never Smoker  . Smokeless tobacco: Never Used  Substance Use Topics  . Alcohol use: Yes    Comment: occasional   Family History  Problem Relation Age of Onset  . Hypertension Mother   . Kidney cancer Mother   . Colon cancer Mother 64  . Stroke Mother        came after cancer dx and thought associated  . Hypertension Father   . Hyperlipidemia Father   . Obesity Brother   . Drug abuse Brother   . High blood pressure Maternal Grandmother   . High blood pressure Maternal Grandfather   . Diabetes Maternal Grandfather   . Glaucoma Other      Review of Systems  Constitutional: Negative for activity change, appetite change, chills, fatigue, fever and unexpected weight change.  HENT: Negative for congestion, ear pain, hearing loss, sinus pressure, sinus pain, sore throat and trouble swallowing.   Eyes: Negative for pain and visual disturbance.  Respiratory: Negative for cough, chest tightness, shortness of breath and wheezing.   Cardiovascular: Negative for chest pain,  palpitations and leg swelling.  Gastrointestinal: Negative for abdominal distention, abdominal pain, blood in stool, constipation, diarrhea, nausea and vomiting.  Genitourinary: Negative for decreased urine volume, difficulty urinating, dysuria, penile pain and testicular pain.  Musculoskeletal: Positive for back pain (improved after injection yesterday). Negative for arthralgias and joint swelling.  Skin: Negative for rash.  Neurological: Negative for dizziness, weakness, numbness and headaches.  Hematological: Negative for adenopathy. Does not bruise/bleed easily.  Psychiatric/Behavioral:  Negative for agitation, sleep disturbance and suicidal ideas. The patient is not nervous/anxious.     CBC:  Lab Results  Component Value Date   WBC 4.5 12/25/2018   HGB 16.7 12/25/2018   HCT 48.8 12/25/2018   MCH 30.5 12/17/2017   MCHC 34.2 12/25/2018   RDW 13.3 12/25/2018   PLT 199.0 12/25/2018   CMP: Lab Results  Component Value Date   NA 138 12/25/2018   K 4.4 12/25/2018   CL 103 12/25/2018   CO2 23 12/25/2018   ANIONGAP 8 12/17/2017   GLUCOSE 85 12/25/2018   BUN 13 12/25/2018   CREATININE 1.41 12/25/2018   GFRAA 45 (L) 12/17/2017   CALCIUM 9.4 12/25/2018   PROT 6.8 12/25/2018   BILITOT 0.6 12/25/2018   ALKPHOS 71 12/25/2018   ALT 25 12/25/2018   AST 19 12/25/2018   LIPID: Lab Results  Component Value Date   CHOL 114 12/25/2018   TRIG 178.0 (H) 12/25/2018   HDL 32.30 (L) 12/25/2018   LDLCALC 46 12/25/2018    Objective:  BP 108/62 (BP Location: Left Arm, Patient Position: Sitting, Cuff Size: Large)   Pulse 88   Temp 98.2 F (36.8 C) (Oral)   Ht 5' 10.5" (1.791 m)   Wt 200 lb 1.6 oz (90.8 kg)   SpO2 96%   BMI 28.31 kg/m   Weight: 200 lb 1.6 oz (90.8 kg)   BP Readings from Last 3 Encounters:  05/28/20 108/62  12/25/18 102/68  04/04/18 102/72   Wt Readings from Last 3 Encounters:  05/28/20 200 lb 1.6 oz (90.8 kg)  12/25/18 192 lb 3.2 oz (87.2 kg)  04/04/18 190 lb (86.2 kg)    Physical Exam Constitutional:      General: He is not in acute distress.    Appearance: He is well-developed.  HENT:     Head: Normocephalic and atraumatic.     Right Ear: External ear normal.     Left Ear: External ear normal.     Nose: Nose normal.     Mouth/Throat:     Pharynx: No oropharyngeal exudate.  Eyes:     Conjunctiva/sclera: Conjunctivae normal.     Pupils: Pupils are equal, round, and reactive to light.  Neck:     Thyroid: No thyromegaly.  Cardiovascular:     Rate and Rhythm: Normal rate and regular rhythm.     Heart sounds: Normal heart sounds. No  murmur heard.  No friction rub. No gallop.   Pulmonary:     Effort: Pulmonary effort is normal. No respiratory distress.     Breath sounds: Normal breath sounds. No stridor. No wheezing or rales.  Abdominal:     General: Bowel sounds are normal.     Palpations: Abdomen is soft.  Musculoskeletal:        General: Normal range of motion.     Cervical back: Neck supple.  Skin:    General: Skin is warm and dry.  Neurological:     Mental Status: He is alert and oriented to person, place, and time.  Psychiatric:        Behavior: Behavior normal.        Thought Content: Thought content normal.        Judgment: Judgment normal.     Assessment/Plan: There are no preventive care reminders to display for this patient. Health Maintenance reviewed - will work on completing preventative immunizations in the next couple of months. I did reach out to GI to see if screening interval for colonoscopy should be changed based on mother having colon cancer.   1. Preventative health care Keep up with healthy lifestyle. Up to date with preventative health care.  2. Essential hypertension Well controlled. Continue current medication: lisinopril 20mg  daily. - CBC with Differential/Platelet; Future - Comprehensive metabolic panel; Future  3. Mixed hyperlipidemia Has been well controlled. Continue pravastatin 40mg .  - Comprehensive metabolic panel; Future - Lipid panel; Future - TSH; Future - pravastatin (PRAVACHOL) 40 MG tablet; Take 1 tablet (40 mg total) by mouth daily.  Dispense: 90 tablet; Refill: 1   4. B12 deficiency Will recheck levels with bloodwork; taking 500mg c daily. - Vitamin B12; Future  5. Senile cataract of right eye, unspecified age-related cataract type Following with ophtho. They are monitoring.   Return for nurse visit flu,shingrix vaccination in about 6 weeks and lab visit same day.  , MD

## 2020-06-02 NOTE — Progress Notes (Signed)
Hi, We will change colonoscopy his recall to Sept 2023, a 5 year interval. Thanks, Judie Petit

## 2020-06-05 NOTE — Addendum Note (Signed)
Addended by: Lerry Liner on: 06/05/2020 03:23 PM   Modules accepted: Orders

## 2020-06-08 ENCOUNTER — Encounter: Payer: Self-pay | Admitting: Family Medicine

## 2020-06-10 ENCOUNTER — Ambulatory Visit: Payer: 59 | Attending: Internal Medicine

## 2020-06-10 DIAGNOSIS — Z23 Encounter for immunization: Secondary | ICD-10-CM

## 2020-06-10 NOTE — Progress Notes (Signed)
   Covid-19 Vaccination Clinic  Name:  Cody Simmons    MRN: [id number] DOB: 07-11-1960  06/10/2020  [name] was observed post Covid-19 immunization for 15 minutes without incident. He was provided with Vaccine Information Sheet and instruction to access the V-Safe system.   [name] was instructed to call 911 with any severe reactions post vaccine: Marland Kitchen Difficulty breathing  . Swelling of face and throat  . A fast heartbeat  . A bad rash all over body  . Dizziness and weakness   Immunizations Administered    Name Date Dose VIS Date Route   Pfizer COVID-19 Vaccine 06/10/2020  1:09 PM 0.3 mL 04/23/2020 Intramuscular   Manufacturer: [employer], [employer]   Lot: [id number]   NDC: [address]-2

## 2020-06-22 ENCOUNTER — Other Ambulatory Visit (INDEPENDENT_AMBULATORY_CARE_PROVIDER_SITE_OTHER): Payer: Self-pay | Admitting: Ophthalmology

## 2020-07-05 ENCOUNTER — Other Ambulatory Visit: Payer: Self-pay | Admitting: Family Medicine

## 2020-07-09 ENCOUNTER — Ambulatory Visit (INDEPENDENT_AMBULATORY_CARE_PROVIDER_SITE_OTHER): Payer: 59 | Admitting: *Deleted

## 2020-07-09 ENCOUNTER — Other Ambulatory Visit: Payer: Self-pay

## 2020-07-09 ENCOUNTER — Other Ambulatory Visit (INDEPENDENT_AMBULATORY_CARE_PROVIDER_SITE_OTHER): Payer: 59

## 2020-07-09 DIAGNOSIS — E538 Deficiency of other specified B group vitamins: Secondary | ICD-10-CM

## 2020-07-09 DIAGNOSIS — E782 Mixed hyperlipidemia: Secondary | ICD-10-CM

## 2020-07-09 DIAGNOSIS — I10 Essential (primary) hypertension: Secondary | ICD-10-CM

## 2020-07-09 DIAGNOSIS — Z23 Encounter for immunization: Secondary | ICD-10-CM

## 2020-07-09 DIAGNOSIS — R944 Abnormal results of kidney function studies: Secondary | ICD-10-CM

## 2020-07-09 LAB — COMPREHENSIVE METABOLIC PANEL
ALT: 25 U/L (ref 0–53)
AST: 20 U/L (ref 0–37)
Albumin: 4.6 g/dL (ref 3.5–5.2)
Alkaline Phosphatase: 85 U/L (ref 39–117)
BUN: 16 mg/dL (ref 6–23)
CO2: 29 mEq/L (ref 19–32)
Calcium: 9.7 mg/dL (ref 8.4–10.5)
Chloride: 104 mEq/L (ref 96–112)
Creatinine, Ser: 1.62 mg/dL — ABNORMAL HIGH (ref 0.40–1.50)
GFR: 45.64 mL/min — ABNORMAL LOW (ref >60.00)
Glucose, Bld: 83 mg/dL (ref 70–99)
Potassium: 4.3 mEq/L (ref 3.5–5.1)
Sodium: 140 mEq/L (ref 135–145)
Total Bilirubin: 0.9 mg/dL (ref 0.2–1.2)
Total Protein: 7.3 g/dL (ref 6.0–8.3)

## 2020-07-09 LAB — CBC WITH DIFFERENTIAL/PLATELET
Basophils Absolute: 0 10*3/uL (ref 0.0–0.1)
Basophils Relative: 0.6 % (ref 0.0–3.0)
Eosinophils Absolute: 0.1 10*3/uL (ref 0.0–0.7)
Eosinophils Relative: 1 % (ref 0.0–5.0)
HCT: 51.2 % (ref 39.0–52.0)
Hemoglobin: 17.5 g/dL — ABNORMAL HIGH (ref 13.0–17.0)
Lymphocytes Relative: 42.5 % (ref 12.0–46.0)
Lymphs Abs: 2.3 10*3/uL (ref 0.7–4.0)
MCHC: 34.1 g/dL (ref 30.0–36.0)
MCV: 90.8 fl (ref 78.0–100.0)
Monocytes Absolute: 0.4 10*3/uL (ref 0.1–1.0)
Monocytes Relative: 7.7 % (ref 3.0–12.0)
Neutro Abs: 2.7 10*3/uL (ref 1.4–7.7)
Neutrophils Relative %: 48.2 % (ref 43.0–77.0)
Platelets: 183 10*3/uL (ref 150.0–400.0)
RBC: 5.64 Mil/uL (ref 4.22–5.81)
RDW: 13.6 % (ref 11.5–15.5)
WBC: 5.5 10*3/uL (ref 4.0–10.5)

## 2020-07-09 LAB — LIPID PANEL
Cholesterol: 157 mg/dL (ref 0–200)
HDL: 37.4 mg/dL — ABNORMAL LOW (ref >39.00)
NonHDL: 119.29
Total CHOL/HDL Ratio: 4
Triglycerides: 225 mg/dL — ABNORMAL HIGH (ref 0.0–149.0)
VLDL: 45 mg/dL — ABNORMAL HIGH (ref 0.0–40.0)

## 2020-07-09 LAB — LDL CHOLESTEROL, DIRECT: Direct LDL: 83 mg/dL

## 2020-07-09 LAB — VITAMIN B12: Vitamin B-12: 837 pg/mL (ref 211–911)

## 2020-07-09 LAB — TSH: TSH: 1.22 u[IU]/mL (ref 0.35–4.50)

## 2020-07-09 NOTE — Addendum Note (Signed)
Addended by: Johnella Moloney on: 07/09/2020 04:20 PM   Modules accepted: Orders

## 2020-07-09 NOTE — Addendum Note (Signed)
Addended by: Lerry Liner on: 07/09/2020 09:30 AM   Modules accepted: Orders

## 2020-07-30 ENCOUNTER — Other Ambulatory Visit: Payer: Self-pay

## 2020-07-30 ENCOUNTER — Other Ambulatory Visit (INDEPENDENT_AMBULATORY_CARE_PROVIDER_SITE_OTHER): Payer: 59

## 2020-07-30 DIAGNOSIS — R944 Abnormal results of kidney function studies: Secondary | ICD-10-CM

## 2020-07-30 LAB — BASIC METABOLIC PANEL
BUN: 15 mg/dL (ref 6–23)
CO2: 27 mEq/L (ref 19–32)
Calcium: 9.7 mg/dL (ref 8.4–10.5)
Chloride: 103 mEq/L (ref 96–112)
Creatinine, Ser: 1.45 mg/dL (ref 0.40–1.50)
GFR: 52.12 mL/min — ABNORMAL LOW (ref >60.00)
Glucose, Bld: 83 mg/dL (ref 70–99)
Potassium: 4 mEq/L (ref 3.5–5.1)
Sodium: 137 mEq/L (ref 135–145)

## 2020-08-18 ENCOUNTER — Other Ambulatory Visit: Payer: Self-pay

## 2020-08-18 ENCOUNTER — Encounter (INDEPENDENT_AMBULATORY_CARE_PROVIDER_SITE_OTHER): Payer: Self-pay | Admitting: Ophthalmology

## 2020-08-18 ENCOUNTER — Ambulatory Visit (INDEPENDENT_AMBULATORY_CARE_PROVIDER_SITE_OTHER): Payer: 59 | Admitting: Ophthalmology

## 2020-08-18 DIAGNOSIS — H30132 Disseminated chorioretinal inflammation, generalized, left eye: Secondary | ICD-10-CM | POA: Insufficient documentation

## 2020-08-18 DIAGNOSIS — H2511 Age-related nuclear cataract, right eye: Secondary | ICD-10-CM | POA: Insufficient documentation

## 2020-08-18 DIAGNOSIS — H35352 Cystoid macular degeneration, left eye: Secondary | ICD-10-CM

## 2020-08-18 DIAGNOSIS — H353122 Nonexudative age-related macular degeneration, left eye, intermediate dry stage: Secondary | ICD-10-CM

## 2020-08-18 NOTE — Progress Notes (Signed)
08/18/2020     CHIEF COMPLAINT Patient presents for Retina Follow Up (1 Year F/U OU//Pt denies noticeable changes to [place] OU since last visit. Pt denies ocular pain, flashes of light, or floaters OU.)   HISTORY OF PRESENT ILLNESS: Cody Simmons is a 60 y.o. male who presents to the clinic today for:   HPI    Retina Follow Up    Patient presents with  Dry AMD.  In both eyes.  This started 1 year ago.  Severity is mild.  Duration of 1 year.  Since onset it is stable. Additional comments: 1 Year F/U OU  Pt denies noticeable changes to [place] OU since last visit. Pt denies ocular pain, flashes of light, or floaters OU.       Last edited by Ileana Roup, COA on 08/18/2020  1:03 PM. (History)      Referring physician: Wynn Banker, MD [address] [place],  [place] [address]  HISTORICAL INFORMATION:   Selected notes from the MEDICAL RECORD NUMBER    Lab Results  Component Value Date   HGBA1C 5.5 11/29/2017     CURRENT MEDICATIONS: Current Outpatient Medications (Ophthalmic Drugs)  Medication Sig  . ketorolac (ACULAR) 0.5 % ophthalmic solution INSTILL 1 DROP INTO THE  LEFT EYE TWICE DAILY  . loteprednol (LOTEMAX) 0.5 % ophthalmic suspension Place 1 drop into the left eye daily.    No current facility-administered medications for this visit. (Ophthalmic Drugs)   Current Outpatient Medications (Other)  Medication Sig  . famciclovir (FAMVIR) 500 MG tablet TAKE 1 TABLET BY MOUTH  DAILY  . folic acid (FOLATE) 400 MCG tablet Take 1 tablet (400 mcg total) by mouth daily.  Marland Kitchen lisinopril (ZESTRIL) 20 MG tablet TAKE 1 TABLET BY MOUTH  DAILY  . pravastatin (PRAVACHOL) 40 MG tablet Take 1 tablet (40 mg total) by mouth daily.  . vitamin B-12 (CYANOCOBALAMIN) 500 MCG tablet Take 500 mcg by mouth daily.   Current Facility-Administered Medications (Other)  Medication Route  . 0.9 %  sodium chloride infusion Intravenous      REVIEW OF SYSTEMS:    ALLERGIES Allergies   Allergen Reactions  . Shellfish Allergy Anaphylaxis    PAST MEDICAL HISTORY Past Medical History:  Diagnosis Date  . Allergy   . Diverticulosis of colon (without mention of hemorrhage)   . GERD (gastroesophageal reflux disease)   . Hiatal hernia   . Hypertension   . Nephrolithiasis   . Retinal necrosis, acute, left   . Sleep apnea    "mild" per pt./does not use CPAP  . Spinal stenosis    saw [place] [facility] in 2015 s/p epidural inj   Past Surgical History:  Procedure Laterality Date  . CATARACT EXTRACTION    . COLONOSCOPY  5035,4656   w/[place]  . RETINAL LASER PROCEDURE      FAMILY HISTORY Family History  Problem Relation Age of Onset  . Hypertension Mother   . Kidney cancer Mother   . Colon cancer Mother 11  . Stroke Mother        came after cancer dx and thought associated  . Hypertension Father   . Hyperlipidemia Father   . Obesity Brother   . Drug abuse Brother   . High blood pressure Maternal Grandmother   . High blood pressure Maternal Grandfather   . Diabetes Maternal Grandfather   . Glaucoma Other     SOCIAL HISTORY Social History   Tobacco Use  . Smoking status: Never Smoker  .  Smokeless tobacco: Never Used  Vaping Use  . Vaping Use: Never used  Substance Use Topics  . Alcohol use: Yes    Comment: occasional  . Drug use: No         OPHTHALMIC EXAM:  Base Eye Exam    Visual Acuity (ETDRS)      Right Left   Dist cc 20/20 20/200 +1   Dist ph cc  NI   Correction: Glasses       Tonometry (Tonopen, 1:03 PM)      Right Left   Pressure 16 10       Pupils      Dark Light Shape React APD   Right 4 3 Round Brisk None   Left 3.5 2.5 Round Brisk None       Visual Fields (Counting fingers)      Left Right     Full   Restrictions Total inferior nasal deficiency        Extraocular Movement      Right Left    Full Full       Neuro/Psych    Oriented x3: Yes   Mood/Affect: Normal       Dilation    Both eyes: 1.0% Mydriacyl, 2.5%  Phenylephrine @ 1:07 PM        Slit Lamp and Fundus Exam    External Exam      Right Left   External Normal Normal       Slit Lamp Exam      Right Left   Lids/Lashes Normal Normal   Conjunctiva/Sclera White and quiet White and quiet   Cornea Clear Clear   Anterior Chamber Deep and quiet Deep and quiet   Iris Round and reactive Dilates poorly   Lens 1+ Nuclear sclerosis Centered posterior chamber intraocular lens   Anterior Vitreous Normal Normal       Fundus Exam      Right Left   Posterior Vitreous Normal Clear, avitric   Disc Normal    C/D Ratio 0.3 0.0, old FPD   Macula Normal    Vessels Normal    Periphery Normal,  No signs of retinitis or vasculitis 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 Scan locations included subfoveal. Central Foveal Thickness: 247. Findings include vitreomacular adhesion .   Left Eye Quality was good. Scan locations included subfoveal. Central Foveal Thickness: 172.   Notes Normal findings right eye, incidental vitreomacular adhesion.  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  Now quiescent and maintain stability for over 15 years on POC famciclovir                ASSESSMENT/PLAN:  Acute retinal necrosis, left eye No active retinitis left eye.  Patient continues on famciclovir 500 mg daily for the remainder of life in order to prevent recurrence of disease in the right eye  Age-related nuclear cataract, right The nature of cataract was discussed with the patient as well as the elective nature of surgery. The patient was reassured that surgery at a later date does not put the patient at risk for a worse outcome. It was emphasized that the need for surgery is dictated by the patient's  quality of life as influenced by the cataract. Patient was instructed to maintain close follow up with their general eye care doctor.  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 Acute retinal necrosis, left eye  H30.132   2. Intermediate stage nonexudative age-related macular degeneration of left eye  H35.3122   3. Cystoid macular edema of left eye  H35.352 OCT, Retina - OU - Both Eyes  4. Age-related nuclear cataract, right  H25.11 OCT, Retina - OU - Both Eyes    1.  Patient to be refilled on famciclovir 500 mg 1 pill p.o. daily for lifetime to prevent acute retinal necrosis developing in the right eye.  2.  Reaffirmed with the patient that using the shingle vaccine events physiologically should be of some help in preventing recurrence  3.  Ophthalmic Meds Ordered this visit:  No orders of the defined types were placed in this encounter.      Return in about 1 year (around 08/18/2021) for COLOR FP, OCT, DILATE OU.  There are no Patient Instructions on file for this visit.   Explained the diagnoses, plan, and follow up with the patient and they expressed understanding.  Patient expressed understanding of the importance of proper follow up care.   Alford Highland Camryn Lampson M.D. Diseases & Surgery of the Retina and Vitreous [facility] 08/18/20     Abbreviations: M myopia (nearsighted); A astigmatism; H hyperopia (farsighted); P presbyopia; Mrx spectacle prescription;  CTL contact lenses; OD right eye; OS left eye; OU both eyes  XT exotropia; ET esotropia; PEK punctate epithelial keratitis; PEE punctate epithelial erosions; DES dry eye syndrome; MGD meibomian gland dysfunction; ATs artificial tears; PFAT's preservative free  artificial tears; NSC nuclear sclerotic cataract; PSC posterior subcapsular cataract; ERM epi-retinal membrane; PVD posterior vitreous detachment; RD retinal detachment; DM diabetes mellitus; DR diabetic retinopathy; NPDR non-proliferative diabetic retinopathy; PDR proliferative diabetic retinopathy; CSME clinically significant macular edema; DME diabetic macular edema; dbh dot blot hemorrhages; CWS cotton wool spot; POAG primary open angle glaucoma; C/D cup-to-disc ratio; HVF humphrey visual field; GVF goldmann visual field; OCT optical coherence tomography; IOP intraocular pressure; BRVO Branch retinal vein occlusion; CRVO central retinal vein occlusion; CRAO central retinal artery occlusion; BRAO branch retinal artery occlusion; RT retinal tear; SB scleral buckle; PPV pars plana vitrectomy; VH Vitreous hemorrhage; PRP panretinal laser photocoagulation; IVK intravitreal kenalog; VMT vitreomacular traction; MH Macular hole;  NVD neovascularization of the disc; NVE neovascularization elsewhere; AREDS age related eye disease study; ARMD age related macular degeneration; POAG primary open angle glaucoma; EBMD epithelial/anterior basement membrane dystrophy; ACIOL anterior chamber intraocular lens; IOL intraocular lens; PCIOL posterior chamber intraocular lens; Phaco/IOL phacoemulsification with intraocular lens placement; PRK photorefractive keratectomy; LASIK laser assisted in situ keratomileusis; HTN hypertension; DM diabetes mellitus; COPD chronic obstructive pulmonary disease

## 2020-08-18 NOTE — Assessment & Plan Note (Signed)
The nature of cataract was discussed with the patient as well as the elective nature of surgery. The patient was reassured that surgery at a later date does not put the patient at risk for a worse outcome. It was emphasized that the need for surgery is dictated by the patient's quality of life as influenced by the cataract. Patient was instructed to maintain close follow up with their general eye care doctor.  Not visually significant early color change observe

## 2020-08-18 NOTE — Assessment & Plan Note (Signed)
History of activity in the past, now quiescent with macular RPE and retinal atrophy

## 2020-08-18 NOTE — Assessment & Plan Note (Signed)
No active retinitis left eye.  Patient continues on famciclovir 500 mg daily for the remainder of life in order to prevent recurrence of disease in the right eye

## 2020-08-18 NOTE — Assessment & Plan Note (Signed)
Macular RPE atrophy

## 2020-09-08 ENCOUNTER — Ambulatory Visit (INDEPENDENT_AMBULATORY_CARE_PROVIDER_SITE_OTHER): Payer: 59

## 2020-09-08 ENCOUNTER — Other Ambulatory Visit: Payer: Self-pay

## 2020-09-08 DIAGNOSIS — Z23 Encounter for immunization: Secondary | ICD-10-CM | POA: Diagnosis not present

## 2020-09-08 NOTE — Progress Notes (Signed)
Pt was given 2nd dose of Shingrix, tolerated well; Right deltoid.

## 2020-10-21 ENCOUNTER — Other Ambulatory Visit: Payer: Self-pay | Admitting: Family Medicine

## 2020-10-21 DIAGNOSIS — E782 Mixed hyperlipidemia: Secondary | ICD-10-CM

## 2020-12-10 ENCOUNTER — Ambulatory Visit: Payer: 59 | Attending: Internal Medicine

## 2020-12-10 ENCOUNTER — Other Ambulatory Visit: Payer: Self-pay

## 2020-12-10 ENCOUNTER — Other Ambulatory Visit (HOSPITAL_BASED_OUTPATIENT_CLINIC_OR_DEPARTMENT_OTHER): Payer: Self-pay

## 2020-12-10 DIAGNOSIS — Z23 Encounter for immunization: Secondary | ICD-10-CM

## 2020-12-10 MED ORDER — PFIZER-BIONT COVID-19 VAC-TRIS 30 MCG/0.3ML IM SUSP
INTRAMUSCULAR | 0 refills | Status: DC
  Filled 2020-12-10: qty 0.3, 1d supply, fill #0

## 2020-12-10 NOTE — Progress Notes (Signed)
   Covid-19 Vaccination Clinic  Name:  DONDRE CATALFAMO    MRN: [id number] DOB: 1961-04-18  12/10/2020  Mr. Jenkin was observed post Covid-19 immunization for 15 minutes without incident. He was provided with Vaccine Information Sheet and instruction to access the V-Safe system.   Mr. Buckwalter was instructed to call 911 with any severe reactions post vaccine: Marland Kitchen Difficulty breathing  . Swelling of face and throat  . A fast heartbeat  . A bad rash all over body  . Dizziness and weakness   Immunizations Administered    Name Date Dose VIS Date Route   PFIZER Comrnaty(Gray TOP) Covid-19 Vaccine 12/10/2020  1:41 PM 0.3 mL 06/12/2020 Intramuscular   Manufacturer: [employer], [employer]   Lot: [id number]   NDC: [phone]

## 2020-12-18 ENCOUNTER — Other Ambulatory Visit: Payer: Self-pay | Admitting: Family Medicine

## 2021-01-30 ENCOUNTER — Emergency Department (HOSPITAL_BASED_OUTPATIENT_CLINIC_OR_DEPARTMENT_OTHER)
Admission: EM | Admit: 2021-01-30 | Discharge: 2021-01-30 | Disposition: A | Discharge status: Home / Self Care | Payer: 59 | Attending: Emergency Medicine | Admitting: Emergency Medicine

## 2021-01-30 ENCOUNTER — Other Ambulatory Visit: Payer: Self-pay

## 2021-01-30 ENCOUNTER — Encounter (HOSPITAL_BASED_OUTPATIENT_CLINIC_OR_DEPARTMENT_OTHER): Payer: Self-pay | Admitting: *Deleted

## 2021-01-30 DIAGNOSIS — R220 Localized swelling, mass and lump, head: Secondary | ICD-10-CM | POA: Diagnosis not present

## 2021-01-30 DIAGNOSIS — Z79899 Other long term (current) drug therapy: Secondary | ICD-10-CM | POA: Insufficient documentation

## 2021-01-30 DIAGNOSIS — Z20822 Contact with and (suspected) exposure to covid-19: Secondary | ICD-10-CM | POA: Diagnosis not present

## 2021-01-30 DIAGNOSIS — I10 Essential (primary) hypertension: Secondary | ICD-10-CM | POA: Insufficient documentation

## 2021-01-30 DIAGNOSIS — J029 Acute pharyngitis, unspecified: Secondary | ICD-10-CM | POA: Diagnosis not present

## 2021-01-30 LAB — RESP PANEL BY RT-PCR (FLU A&B, COVID) ARPGX2
Influenza A by PCR: NEGATIVE
Influenza B by PCR: NEGATIVE
SARS Coronavirus 2 by RT PCR: NEGATIVE

## 2021-01-30 MED ORDER — CHLORHEXIDINE GLUCONATE 0.12 % MT SOLN: 15.0000 mL | Freq: Two times a day (BID) | OROMUCOSAL | 0 refills | Status: DC

## 2021-01-30 MED ORDER — LIDOCAINE VISCOUS HCL 2 % MT SOLN: 15.0000 mL | OROMUCOSAL | 0 refills | Status: DC | PRN

## 2021-01-30 NOTE — ED Notes (Signed)
ED Provider at bedside. 

## 2021-01-30 NOTE — ED Provider Notes (Signed)
[facility] EMERGENCY DEPT Provider Note   CSN: [id number] Arrival date & time: 01/30/21  1231     History Chief Complaint  Patient presents with   Oral Swelling    Cody Simmons is a 60 y.o. male.  Cody Simmons states that he had a gastrointestinal illness about 8 days ago.  2 days ago, he began to have some swelling on the left side of his face.  This increased to involve both sides of his face yesterday.  He also has a sore throat and swollen lips.  He took some Benadryl with some mild relief.  Something similar has happened to him in the past, and he is curious about the etiology of his symptoms.  The history is provided by the patient.  Sore Throat This is a new problem. The current episode started 2 days ago. The problem occurs constantly. The problem has been gradually improving. Pertinent negatives include no chest pain, no abdominal pain, no headaches and no shortness of breath. Nothing aggravates the symptoms. Nothing relieves the symptoms. Treatments tried: benadryl. The treatment provided mild relief.      Past Medical History:  Diagnosis Date   Allergy    Diverticulosis of colon (without mention of hemorrhage)    GERD (gastroesophageal reflux disease)    Hiatal hernia    Hypertension    Nephrolithiasis    Retinal necrosis, acute, left    Sleep apnea    "mild" per pt./does not use CPAP   Spinal stenosis    saw [place] [facility] in 2015 s/p epidural inj    Patient Active Problem List   Diagnosis Date Noted   Intermediate stage nonexudative age-related macular degeneration of left eye 08/18/2020   Cystoid macular edema of left eye 08/18/2020   Acute retinal necrosis, left eye 08/18/2020   Age-related nuclear cataract, right 08/18/2020   Cataract, right eye 05/28/2020   OSA (obstructive sleep apnea) 03/24/2016   Hypersomnia 01/14/2016   GERD (gastroesophageal reflux disease) 12/24/2014   Erectile disorder, acquired, situational, mild 12/24/2014    MIXED HYPERLIPIDEMIA 06/07/2007   Essential hypertension 03/17/2007   Allergic rhinitis 03/17/2007    Past Surgical History:  Procedure Laterality Date   CATARACT EXTRACTION     COLONOSCOPY  2008,2019   w/[facility]   RETINAL LASER PROCEDURE         Family History  Problem Relation Age of Onset   Hypertension Mother    Kidney cancer Mother    Colon cancer Mother 81   Stroke Mother        came after cancer dx and thought associated   Hypertension Father    Hyperlipidemia Father    Obesity Brother    Drug abuse Brother    High blood pressure Maternal Grandmother    High blood pressure Maternal Grandfather    Diabetes Maternal Grandfather    Glaucoma Other     Social History   Tobacco Use   Smoking status: Never   Smokeless tobacco: Never  Vaping Use   Vaping Use: Never used  Substance Use Topics   Alcohol use: Yes    Comment: occasional   Drug use: No    Home Medications Prior to Admission medications   Medication Sig Start Date End Date Taking? Authorizing Provider  famciclovir (FAMVIR) 500 MG tablet TAKE 1 TABLET BY MOUTH  DAILY 03/17/20  Yes Rankin, Alford Highland, MD  folic acid (FOLATE) 400 MCG tablet Take 1 tablet (400 mcg total) by mouth daily. 01/22/19  Yes  Koberlein, Paris Lore, MD  ketorolac (ACULAR) 0.5 % ophthalmic solution INSTILL 1 DROP INTO THE  LEFT EYE TWICE DAILY 06/22/20  Yes Rankin, Alford Highland, MD  lisinopril (ZESTRIL) 20 MG tablet TAKE 1 TABLET BY MOUTH  DAILY 12/19/20  Yes Koberlein, Junell C, MD  loteprednol (LOTEMAX) 0.5 % ophthalmic suspension Place 1 drop into the left eye daily.    Yes [provider]  pravastatin (PRAVACHOL) 40 MG tablet TAKE 1 TABLET BY MOUTH  DAILY 10/23/20  Yes Koberlein, Junell C, MD  vitamin B-12 (CYANOCOBALAMIN) 500 MCG tablet Take 500 mcg by mouth daily.   Yes [provider]  COVID-19 mRNA Vac-TriS, Pfizer, (PFIZER-BIONT COVID-19 VAC-TRIS) SUSP injection Inject into the muscle. 12/10/20   Judyann Munson, MD     Allergies    Shellfish allergy  Review of Systems   Review of Systems  Constitutional:  Negative for chills and fever.  HENT:  Positive for facial swelling. Negative for ear pain and sore throat.   Eyes:  Negative for pain and visual disturbance.  Respiratory:  Negative for cough and shortness of breath.   Cardiovascular:  Negative for chest pain and palpitations.  Gastrointestinal:  Negative for abdominal pain and vomiting.  Genitourinary:  Negative for dysuria and hematuria.  Musculoskeletal:  Negative for arthralgias and back pain.  Skin:  Negative for color change and rash.  Neurological:  Negative for seizures, syncope and headaches.  All other systems reviewed and are negative.  Physical Exam Updated Vital Signs BP 117/88 (BP Location: Right Arm)   Pulse 100   Temp 98.7 F (37.1 C)   Resp 16   Ht 5\' 9"  (1.753 m)   Wt 80.7 kg   SpO2 97%   BMI 26.29 kg/m   Physical Exam Vitals and nursing note reviewed.  Constitutional:      Appearance: Normal appearance.  HENT:     Head: Normocephalic and atraumatic.     Comments: Minimal swelling at left face    Nose: Nose normal.     Mouth/Throat:     Comments: swelling and intense erythema of the inner lips.  There is diffuse erythema of the posterior pharynx and punctate ulcers scattered at the soft palate Handling secretions, speaking in full sentences Eyes:     Conjunctiva/sclera: Conjunctivae normal.  Pulmonary:     Effort: Pulmonary effort is normal. No respiratory distress.  Musculoskeletal:        General: No deformity. Normal range of motion.     Cervical back: Normal range of motion. No rigidity.  Lymphadenopathy:     Cervical: No cervical adenopathy.  Skin:    General: Skin is warm and dry.  Neurological:     General: No focal deficit present.     Mental Status: He is alert and oriented to person, place, and time. Mental status is at baseline.  Psychiatric:        Mood and Affect: Mood normal.    ED  Results / Procedures / Treatments   Labs (all labs ordered are listed, but only abnormal results are displayed) Labs Reviewed  RESP PANEL BY RT-PCR (FLU A&B, COVID) [id number]    EKG None  Radiology No results found.  Procedures Procedures   Medications Ordered in ED Medications - No data to display  ED Course  I have reviewed the triage vital signs and the nursing notes.  Pertinent labs & imaging results that were available during my care of the patient were reviewed by me and considered in  my medical decision making (see chart for details).    MDM Rules/Calculators/A&P                           Gaye Alken Leabo presents with facial, lip, and throat swelling.  This does not look like an allergic reaction.  It is intensely red, and it looks most consistent with a viral illness.  It also does not look consistent with trench mouth or a bacterial process.  The patient was very interested in knowing exactly what the cause was, and I counseled him that it would be difficult to determine the exact etiology of the symptoms.  We could perform a viral panel, but it would unlikely change management.  He was curious about COVID-19, and this was ruled out.  We talked about blood work since he has had decreased oral intake.  However, he is able to eat and drink.  He preferred oral hydration over lab work.  Again, this would unlikely change any management.  Finally, we discussed the possibility of an abscess of the face.  This is unlikely as well, and CT scan is not indicated.  I counseled him to try symptomatic management and follow closely with his doctor if symptoms persist. Final Clinical Impression(s) / ED Diagnoses Final diagnoses:  Pharyngitis, unspecified etiology  Lip swelling  Facial swelling    Rx / DC Orders ED Discharge Orders     None        Koleen Distance, MD 01/30/21 1446

## 2021-01-30 NOTE — ED Triage Notes (Signed)
Patient woke up yesterday with swollen lips and jaws.  He took Ibuprofen and antihistamine yesterday.  Came in today to know as to what cause the swelling.  Gums is also tender.

## 2021-02-01 ENCOUNTER — Emergency Department (HOSPITAL_BASED_OUTPATIENT_CLINIC_OR_DEPARTMENT_OTHER)
Admission: EM | Admit: 2021-02-01 | Discharge: 2021-02-02 | Disposition: A | Discharge status: Home / Self Care | Payer: 59 | Attending: Emergency Medicine | Admitting: Emergency Medicine

## 2021-02-01 ENCOUNTER — Other Ambulatory Visit: Payer: Self-pay

## 2021-02-01 DIAGNOSIS — I10 Essential (primary) hypertension: Secondary | ICD-10-CM | POA: Diagnosis not present

## 2021-02-01 DIAGNOSIS — R319 Hematuria, unspecified: Secondary | ICD-10-CM | POA: Diagnosis present

## 2021-02-01 DIAGNOSIS — N3001 Acute cystitis with hematuria: Secondary | ICD-10-CM | POA: Insufficient documentation

## 2021-02-01 DIAGNOSIS — Z79899 Other long term (current) drug therapy: Secondary | ICD-10-CM | POA: Insufficient documentation

## 2021-02-02 ENCOUNTER — Encounter (HOSPITAL_BASED_OUTPATIENT_CLINIC_OR_DEPARTMENT_OTHER): Payer: Self-pay

## 2021-02-02 LAB — URINALYSIS, ROUTINE W REFLEX MICROSCOPIC
Bilirubin Urine: NEGATIVE
Glucose, UA: NEGATIVE mg/dL
Ketones, ur: NEGATIVE mg/dL
Nitrite: NEGATIVE
Protein, ur: 100 mg/dL — AB
RBC / HPF: >50 RBC/hpf — ABNORMAL HIGH (ref 0–5)
Specific Gravity, Urine: 1.028 (ref 1.005–1.030)
WBC, UA: >50 WBC/hpf — ABNORMAL HIGH (ref 0–5)
pH: 5.5 (ref 5.0–8.0)

## 2021-02-02 MED ORDER — CEPHALEXIN 500 MG PO CAPS: 500.0000 mg | ORAL_CAPSULE | Freq: Three times a day (TID) | ORAL | 0 refills | Status: DC

## 2021-02-02 MED ORDER — CEPHALEXIN 250 MG PO CAPS
500.0000 mg | ORAL_CAPSULE | Freq: Once | ORAL | Status: AC
  Administered 2021-02-02: 500 mg via ORAL
  Filled 2021-02-02: qty 2

## 2021-02-02 NOTE — ED Triage Notes (Signed)
Pt is present for dark red, scant blood in his urine. Pt noticed blood one time, just prior to arrival. Pt also c/o discomfort upon urination. Denies N/V or GI issues.

## 2021-02-02 NOTE — ED Notes (Signed)
Pt verbalizes understanding of discharge instructions. Opportunity for questioning and answers were provided. Armand removed by staff, pt discharged from ED to home. Pt slightly upset that we were unable to send Rx to pharmacy, instead of him dropping Rx off.

## 2021-02-02 NOTE — Discharge Instructions (Addendum)
You were found to have urinary tract infection.  If you develop back pain or fevers, you should be reevaluated.

## 2021-02-02 NOTE — ED Provider Notes (Signed)
[facility] EMERGENCY DEPT Provider Note   CSN: [id number] Arrival date & time: 02/01/21  2355     History Chief Complaint  Patient presents with   Hematuria    Cody Simmons is a 60 y.o. male.  HPI     This is a 60 year old male with a history of hypertension, kidney stones, reflux who presents with hematuria.  Patient reports recent history of diarrheal illness.  He states that he has mostly gotten over that.  However, tonight he went to urinate and noted gross blood in his urine.  He has also noted some dysuria.  No frequency or urgency.  No back pain or fevers.  He does have a history of kidney stones but denies abdominal or flank pain.  No nausea or vomiting.  He is otherwise asymptomatic.  Past Medical History:  Diagnosis Date   Allergy    Diverticulosis of colon (without mention of hemorrhage)    GERD (gastroesophageal reflux disease)    Hiatal hernia    Hypertension    Nephrolithiasis    Retinal necrosis, acute, left    Sleep apnea    "mild" per pt./does not use CPAP   Spinal stenosis    saw [place] [facility] in 2015 s/p epidural inj    Patient Active Problem List   Diagnosis Date Noted   Intermediate stage nonexudative age-related macular degeneration of left eye 08/18/2020   Cystoid macular edema of left eye 08/18/2020   Acute retinal necrosis, left eye 08/18/2020   Age-related nuclear cataract, right 08/18/2020   Cataract, right eye 05/28/2020   OSA (obstructive sleep apnea) 03/24/2016   Hypersomnia 01/14/2016   GERD (gastroesophageal reflux disease) 12/24/2014   Erectile disorder, acquired, situational, mild 12/24/2014   MIXED HYPERLIPIDEMIA 06/07/2007   Essential hypertension 03/17/2007   Allergic rhinitis 03/17/2007    Past Surgical History:  Procedure Laterality Date   CATARACT EXTRACTION     COLONOSCOPY  2008,2019   w/[facility]   RETINAL LASER PROCEDURE         Family History  Problem Relation Age of Onset   Hypertension Mother     Kidney cancer Mother    Colon cancer Mother 30   Stroke Mother        came after cancer dx and thought associated   Hypertension Father    Hyperlipidemia Father    Obesity Brother    Drug abuse Brother    High blood pressure Maternal Grandmother    High blood pressure Maternal Grandfather    Diabetes Maternal Grandfather    Glaucoma Other     Social History   Tobacco Use   Smoking status: Never   Smokeless tobacco: Never  Vaping Use   Vaping Use: Never used  Substance Use Topics   Alcohol use: Yes    Comment: occasional   Drug use: No    Home Medications Prior to Admission medications   Medication Sig Start Date End Date Taking? Authorizing Provider  cephALEXin (KEFLEX) 500 MG capsule Take 1 capsule (500 mg total) by mouth 3 (three) times daily. 02/02/21  Yes Mickelle Goupil, Mayer Masker, MD  chlorhexidine (PERIDEX) 0.12 % solution Use as directed 15 mLs in the mouth or throat 2 (two) times daily. 01/30/21   Koleen Distance, MD  COVID-19 mRNA Vac-TriS, Pfizer, (PFIZER-BIONT COVID-19 VAC-TRIS) SUSP injection Inject into the muscle. 12/10/20   Judyann Munson, MD  famciclovir [facility]) 500 MG tablet TAKE 1 TABLET BY MOUTH  DAILY 03/17/20   Rankin, Alford Highland, MD  folic acid (FOLATE) 400 MCG tablet Take 1 tablet (400 mcg total) by mouth daily. 01/22/19   Wynn Banker, MD  ketorolac (ACULAR) 0.5 % ophthalmic solution INSTILL 1 DROP INTO THE  LEFT EYE TWICE DAILY 06/22/20   Rankin, Alford Highland, MD  lidocaine (XYLOCAINE) 2 % solution Use as directed 15 mLs in the mouth or throat as needed for mouth pain. 01/30/21   Koleen Distance, MD  lisinopril (ZESTRIL) 20 MG tablet TAKE 1 TABLET BY MOUTH  DAILY 12/19/20   Koberlein, Paris Lore, MD  loteprednol (LOTEMAX) 0.5 % ophthalmic suspension Place 1 drop into the left eye daily.     [provider]  pravastatin (PRAVACHOL) 40 MG tablet TAKE 1 TABLET BY MOUTH  DAILY 10/23/20   Koberlein, Paris Lore, MD  vitamin B-12 (CYANOCOBALAMIN) 500 MCG tablet Take 500  mcg by mouth daily.    [provider]    Allergies    Shellfish allergy  Review of Systems   Review of Systems  Constitutional:  Negative for fever.  Respiratory:  Negative for shortness of breath.   Cardiovascular:  Negative for chest pain.  Gastrointestinal:  Negative for abdominal pain.  Genitourinary:  Positive for dysuria and hematuria.  All other systems reviewed and are negative.  Physical Exam Updated Vital Signs BP 128/81   Pulse 79   Temp 98.7 F (37.1 C)   Resp 17   Ht 1.753 m (5\' 9" )   Wt 82.2 kg   SpO2 99%   BMI 26.76 kg/m   Physical Exam Vitals and nursing note reviewed.  Constitutional:      Appearance: He is well-developed. He is not ill-appearing.  HENT:     Head: Normocephalic and atraumatic.     Mouth/Throat:     Mouth: Mucous membranes are moist.  Eyes:     Pupils: Pupils are equal, round, and reactive to light.  Cardiovascular:     Rate and Rhythm: Normal rate and regular rhythm.     Heart sounds: Normal heart sounds. No murmur heard. Pulmonary:     Effort: Pulmonary effort is normal. No respiratory distress.     Breath sounds: Normal breath sounds. No wheezing.  Abdominal:     General: Bowel sounds are normal.     Palpations: Abdomen is soft.     Tenderness: There is no abdominal tenderness. There is no right CVA tenderness, left CVA tenderness or rebound.  Musculoskeletal:     Cervical back: Neck supple.  Lymphadenopathy:     Cervical: No cervical adenopathy.  Skin:    General: Skin is warm and dry.  Neurological:     Mental Status: He is alert and oriented to person, place, and time.  Psychiatric:        Mood and Affect: Mood normal.    ED Results / Procedures / Treatments   Labs (all labs ordered are listed, but only abnormal results are displayed) Labs Reviewed  URINALYSIS, ROUTINE W REFLEX MICROSCOPIC - Abnormal; Notable for the following components:      Result Value   Color, Urine ORANGE (*)    APPearance CLOUDY  (*)    Hgb urine dipstick LARGE (*)    Protein, ur 100 (*)    Leukocytes,Ua LARGE (*)    RBC / HPF >50 (*)    WBC, UA >50 (*)    All other components within normal limits  URINE CULTURE    EKG None  Radiology No results found.  Procedures Procedures  Medications Ordered in ED Medications  cephALEXin (KEFLEX) capsule 500 mg (500 mg Oral Given 02/02/21 0046)    ED Course  I have reviewed the triage vital signs and the nursing notes.  Pertinent labs & imaging results that were available during my care of the patient were reviewed by me and considered in my medical decision making (see chart for details).    MDM Rules/Calculators/A&P                           Patient presents with hematuria.  He is nontoxic and vital signs are reassuring.  He otherwise has some dysuria.  No flank pain or fevers to suggest pyelonephritis.  Has a history of kidney stone but given absence of pain, feel this would be less likely.  Urinalysis obtained.  He has both greater than 50 red cells and greater than 50 white cells in his urine.  Given dysuria, feel this is most likely a UTI.  This is likely causing his bleeding.  Will discharge home with Keflex.  After history, exam, and medical workup I feel the patient has been appropriately medically screened and is safe for discharge home. Pertinent diagnoses were discussed with the patient. Patient was given return precautions.  Final Clinical Impression(s) / ED Diagnoses Final diagnoses:  Acute cystitis with hematuria    Rx / DC Orders ED Discharge Orders          Ordered    cephALEXin (KEFLEX) 500 MG capsule  3 times daily        02/02/21 0107             Lytle Malburg, Mayer Masker, MD 02/02/21 0110

## 2021-02-04 LAB — URINE CULTURE: Culture: >=100000 — AB

## 2021-02-05 ENCOUNTER — Telehealth: Payer: Self-pay | Admitting: *Deleted

## 2021-02-05 NOTE — Progress Notes (Signed)
ED Antimicrobial Stewardship Positive Culture Follow Up   Cody Simmons is an 60 y.o. male who presented to [facility] on 02/01/2021 with a chief complaint of  Chief Complaint  Patient presents with   Hematuria    Recent Results (from the past 720 hour(s))  Resp Panel by RT-PCR (Flu A&B, Covid) Nasopharyngeal Swab     Status: None   Collection Time: 01/30/21  1:05 PM   Specimen: Nasopharyngeal Swab; Nasopharyngeal(NP) swabs in vial transport medium  Result Value Ref Range Status   SARS Coronavirus 2 by RT PCR NEGATIVE NEGATIVE Final    Comment: (NOTE) SARS-CoV-2 target nucleic acids are NOT DETECTED.  The SARS-CoV-2 RNA is generally detectable in upper respiratory specimens during the acute phase of infection. The lowest concentration of SARS-CoV-2 viral copies this assay can detect is 138 copies/mL. A negative result does not preclude SARS-Cov-2 infection and should not be used as the sole basis for treatment or other patient management decisions. A negative result may occur with  improper specimen collection/handling, submission of specimen other than nasopharyngeal swab, presence of viral mutation(s) within the areas targeted by this assay, and inadequate number of viral copies(<138 copies/mL). A negative result must be combined with clinical observations, patient history, and epidemiological information. The expected result is Negative.  Fact Sheet for Patients:  BloggerCourse.com  Fact Sheet for Healthcare Providers:  SeriousBroker.it  This test is no t yet approved or cleared by the [place] FDA and  has been authorized for detection and/or diagnosis of SARS-CoV-2 by FDA under an Emergency Use Authorization (EUA). This EUA will remain  in effect (meaning this test can be used) for the duration of the COVID-19 declaration under Section 564(b)(1) of the Act, 21 U.S.C.section 360bbb-3(b)(1), unless the authorization  is terminated  or revoked sooner.       Influenza A by PCR NEGATIVE NEGATIVE Final   Influenza B by PCR NEGATIVE NEGATIVE Final    Comment: (NOTE) The Xpert Xpress SARS-CoV-2/FLU/RSV plus assay is intended as an aid in the diagnosis of influenza from Nasopharyngeal swab specimens and should not be used as a sole basis for treatment. Nasal washings and aspirates are unacceptable for Xpert Xpress SARS-CoV-2/FLU/RSV testing.  Fact Sheet for Patients: BloggerCourse.com  Fact Sheet for Healthcare Providers: SeriousBroker.it  This test is not yet approved or cleared by the [place] FDA and has been authorized for detection and/or diagnosis of SARS-CoV-2 by FDA under an Emergency Use Authorization (EUA). This EUA will remain in effect (meaning this test can be used) for the duration of the COVID-19 declaration under Section 564(b)(1) of the Act, 21 U.S.C. section 360bbb-3(b)(1), unless the authorization is terminated or revoked.  Performed at [facility], [address], [place], [place] [address]   Urine Culture     Status: Abnormal   Collection Time: 02/02/21 12:40 AM   Specimen: Urine, Clean Catch  Result Value Ref Range Status   Specimen Description   Final    URINE, CLEAN CATCH Performed at [facility], [address] [place], [place] [address]    Special Requests   Final    NONE Performed at [facility], [address], [place], [place] [address]    Culture (A)  Final    >=100,000 COLONIES/mL ESCHERICHIA COLI Confirmed Extended Spectrum Beta-Lactamase Producer (ESBL).  In bloodstream infections from ESBL organisms, carbapenems are preferred over piperacillin/tazobactam. They are shown to have a lower risk of mortality.    Report Status 02/04/2021 FINAL  Final   Organism ID, Bacteria ESCHERICHIA COLI (A)  Final      Susceptibility   Escherichia coli -  MIC*    AMPICILLIN >=32 RESISTANT Resistant     CEFAZOLIN >=64 RESISTANT Resistant     CEFEPIME 16 RESISTANT Resistant     CEFTRIAXONE >=64 RESISTANT Resistant     CIPROFLOXACIN >=4 RESISTANT Resistant     GENTAMICIN >=16 RESISTANT Resistant     IMIPENEM <=0.25 SENSITIVE Sensitive     NITROFURANTOIN <=16 SENSITIVE Sensitive     TRIMETH/SULFA >=320 RESISTANT Resistant     AMPICILLIN/SULBACTAM >=32 RESISTANT Resistant     PIP/TAZO 8 SENSITIVE Sensitive     * >=100,000 COLONIES/mL ESCHERICHIA COLI    [x]  Treated with Cephalexin, organism resistant to prescribed antimicrobial   New antibiotic prescription:  Macrobid 100 mg PO BID x7 days  ED Provider: , PA-C   Berle Mull, PharmD PGY2 Infectious Diseases Pharmacy Resident   Please check [email or web address] for unit-specific pharmacy phone numbers

## 2021-02-05 NOTE — Telephone Encounter (Signed)
Post ED Visit - Positive Culture Follow-up: Unsuccessful Patient Follow-up  Culture assessed and recommendations reviewed by:  []  , Pharm.D. []  Enzo Bi, Pharm.D., BCPS AQ-ID []  , Pharm.D., BCPS []  Celedonio Miyamoto, .D., BCPS []  [place], .D., BCPS, AAHIVP []  Georgina Pillion, Pharm.D., BCPS, AAHIVP []  [address], PharmD []  , PharmD, BCPS  Positive urine culture  []  Patient discharged without antimicrobial prescription and treatment is now indicated [x]  Organism is resistant to prescribed ED discharge antimicrobial []  Patient with positive blood cultures  Plan:  Stop Cephalexin, Start Macrobid PO 100mg  BID x 7 days, [place], PAC  Unable to contact patient after 3 attempts, letter will be sent to address on file  [address] 02/05/2021, 9:43 AM

## 2021-02-13 ENCOUNTER — Other Ambulatory Visit: Payer: Self-pay

## 2021-02-13 ENCOUNTER — Encounter: Payer: Self-pay | Admitting: Family Medicine

## 2021-02-13 ENCOUNTER — Encounter (HOSPITAL_COMMUNITY): Payer: Self-pay | Admitting: Emergency Medicine

## 2021-02-13 ENCOUNTER — Ambulatory Visit (HOSPITAL_COMMUNITY)
Admission: EM | Admit: 2021-02-13 | Discharge: 2021-02-13 | Disposition: A | Discharge status: Home / Self Care | Payer: 59 | Attending: Family Medicine | Admitting: Family Medicine

## 2021-02-13 DIAGNOSIS — R310 Gross hematuria: Secondary | ICD-10-CM | POA: Insufficient documentation

## 2021-02-13 DIAGNOSIS — N390 Urinary tract infection, site not specified: Secondary | ICD-10-CM

## 2021-02-13 LAB — POCT URINALYSIS DIPSTICK, ED / UC
Bilirubin Urine: NEGATIVE
Glucose, UA: NEGATIVE mg/dL
Ketones, ur: NEGATIVE mg/dL
Nitrite: POSITIVE — AB
Protein, ur: 30 mg/dL — AB
Specific Gravity, Urine: 1.015 (ref 1.005–1.030)
Urobilinogen, UA: 0.2 mg/dL (ref 0.0–1.0)
pH: 5.5 (ref 5.0–8.0)

## 2021-02-13 MED ORDER — NITROFURANTOIN MONOHYD MACRO 100 MG PO CAPS: 100.0000 mg | ORAL_CAPSULE | Freq: Two times a day (BID) | ORAL | 0 refills | Status: DC

## 2021-02-13 NOTE — ED Provider Notes (Signed)
[facility]    CSN: [id number] Arrival date & time: 02/13/21  1124      History   Chief Complaint Chief Complaint  Patient presents with   Hematuria   Dysuria    HPI Cody Simmons is a 60 y.o. male.   Patient presenting today with ongoing hematuria, dysuria, urinary urgency for the past week and 1/2 to 2 weeks.  Was seen for this issue in the emergency department on 02/02/2021, diagnosed with a urinary tract infection and placed on Keflex.  He states that he initially had some improvement in symptoms, but several days after completing the medication symptoms returned in full.  He denies fevers, chills, nausea, vomiting, significant flank pain, bowel changes.  Not currently taking anything over-the-counter for symptoms.  History of kidney stones, erectile dysfunction but otherwise no chronic urologic issues.   Past Medical History:  Diagnosis Date   Allergy    Diverticulosis of colon (without mention of hemorrhage)    GERD (gastroesophageal reflux disease)    Hiatal hernia    Hypertension    Nephrolithiasis    Retinal necrosis, acute, left    Sleep apnea    "mild" per pt./does not use CPAP   Spinal stenosis    saw [facility] in 2015 s/p epidural inj    Patient Active Problem List   Diagnosis Date Noted   Intermediate stage nonexudative age-related macular degeneration of left eye 08/18/2020   Cystoid macular edema of left eye 08/18/2020   Acute retinal necrosis, left eye 08/18/2020   Age-related nuclear cataract, right 08/18/2020   Cataract, right eye 05/28/2020   OSA (obstructive sleep apnea) 03/24/2016   Hypersomnia 01/14/2016   GERD (gastroesophageal reflux disease) 12/24/2014   Erectile disorder, acquired, situational, mild 12/24/2014   MIXED HYPERLIPIDEMIA 06/07/2007   Essential hypertension 03/17/2007   Allergic rhinitis 03/17/2007    Past Surgical History:  Procedure Laterality Date   CATARACT EXTRACTION     COLONOSCOPY  2008,2019    w/[facility]   RETINAL LASER PROCEDURE         Home Medications    Prior to Admission medications   Medication Sig Start Date End Date Taking? Authorizing Provider  nitrofurantoin, macrocrystal-monohydrate, (MACROBID) 100 MG capsule Take 1 capsule (100 mg total) by mouth 2 (two) times daily. 02/13/21  Yes Particia Nearing, PA-C  cephALEXin (KEFLEX) 500 MG capsule Take 1 capsule (500 mg total) by mouth 3 (three) times daily. 02/02/21   Horton, Mayer Masker, MD  chlorhexidine (PERIDEX) 0.12 % solution Use as directed 15 mLs in the mouth or throat 2 (two) times daily. 01/30/21   Koleen Distance, MD  COVID-19 mRNA Vac-TriS, Pfizer, (PFIZER-BIONT COVID-19 VAC-TRIS) SUSP injection Inject into the muscle. 12/10/20   Judyann Munson, MD  famciclovir [facility]) 500 MG tablet TAKE 1 TABLET BY MOUTH  DAILY 03/17/20   Rankin, Alford Highland, MD  folic acid (FOLATE) 400 MCG tablet Take 1 tablet (400 mcg total) by mouth daily. 01/22/19   Wynn Banker, MD  ketorolac (ACULAR) 0.5 % ophthalmic solution INSTILL 1 DROP INTO THE  LEFT EYE TWICE DAILY 06/22/20   Rankin, Alford Highland, MD  lidocaine (XYLOCAINE) 2 % solution Use as directed 15 mLs in the mouth or throat as needed for mouth pain. 01/30/21   Koleen Distance, MD  lisinopril (ZESTRIL) 20 MG tablet TAKE 1 TABLET BY MOUTH  DAILY 12/19/20   Koberlein, Paris Lore, MD  loteprednol (LOTEMAX) 0.5 % ophthalmic suspension Place 1 drop into  the left eye daily.     [provider]  pravastatin (PRAVACHOL) 40 MG tablet TAKE 1 TABLET BY MOUTH  DAILY 10/23/20   Koberlein, Paris Lore, MD  vitamin B-12 (CYANOCOBALAMIN) 500 MCG tablet Take 500 mcg by mouth daily.    [provider]    Family History Family History  Problem Relation Age of Onset   Hypertension Mother    Kidney cancer Mother    Colon cancer Mother 20   Stroke Mother        came after cancer dx and thought associated   Hypertension Father    Hyperlipidemia Father    Obesity Brother    Drug abuse  Brother    High blood pressure Maternal Grandmother    High blood pressure Maternal Grandfather    Diabetes Maternal Grandfather    Glaucoma Other     Social History Social History   Tobacco Use   Smoking status: Never   Smokeless tobacco: Never  Vaping Use   Vaping Use: Never used  Substance Use Topics   Alcohol use: Yes    Comment: occasional   Drug use: No     Allergies   Shellfish allergy   Review of Systems Review of Systems Per HPI  Physical Exam Triage Vital Signs ED Triage Vitals  Enc Vitals Group     BP 02/13/21 1327 113/78     Pulse Rate 02/13/21 1327 66     Resp 02/13/21 1327 18     Temp 02/13/21 1327 98.4 F (36.9 C)     Temp Source 02/13/21 1327 Oral     SpO2 02/13/21 1327 99 %     Weight --      Height --      Head Circumference --      Peak Flow --      Pain Score 02/13/21 1325 0     Pain Loc --      Pain Edu? --      Excl. in GC? --    No data found.  Updated Vital Signs BP 113/78 (BP Location: Right Arm)   Pulse 66   Temp 98.4 F (36.9 C) (Oral)   Resp 18   SpO2 99%   Visual Acuity Right Eye Distance:   Left Eye Distance:   Bilateral Distance:    Right Eye Near:   Left Eye Near:    Bilateral Near:     Physical Exam Vitals and nursing note reviewed.  Constitutional:      Appearance: Normal appearance.  HENT:     Head: Atraumatic.     Mouth/Throat:     Mouth: Mucous membranes are moist.  Eyes:     Extraocular Movements: Extraocular movements intact.     Conjunctiva/sclera: Conjunctivae normal.  Cardiovascular:     Rate and Rhythm: Normal rate and regular rhythm.  Pulmonary:     Effort: Pulmonary effort is normal.     Breath sounds: Normal breath sounds.  Abdominal:     General: Bowel sounds are normal. There is no distension.     Palpations: Abdomen is soft.     Tenderness: There is no abdominal tenderness. There is no right CVA tenderness, left CVA tenderness or guarding.  Musculoskeletal:        General: Normal  range of motion.     Cervical back: Normal range of motion and neck supple.  Skin:    General: Skin is warm and dry.  Neurological:     General: No focal  deficit present.     Mental Status: He is oriented to person, place, and time.  Psychiatric:        Mood and Affect: Mood normal.        Thought Content: Thought content normal.        Judgment: Judgment normal.     UC Treatments / Results  Labs (all labs ordered are listed, but only abnormal results are displayed) Labs Reviewed  POCT URINALYSIS DIPSTICK, ED / UC - Abnormal; Notable for the following components:      Result Value   Hgb urine dipstick LARGE (*)    Protein, ur 30 (*)    Nitrite POSITIVE (*)    Leukocytes,Ua SMALL (*)    All other components within normal limits  URINE CULTURE    EKG   Radiology No results found.  Procedures Procedures (including critical care time)  Medications Ordered in UC Medications - No data to display  Initial Impression / Assessment and Plan / UC Course  I have reviewed the triage vital signs and the nursing notes.  Pertinent labs & imaging results that were available during my care of the patient were reviewed by me and considered in my medical decision making (see chart for details).     Urinalysis today indicative of ongoing urinary tract infection.  Urine culture pending.  Per chart review, urine culture from the emergency department showed resistance to the Keflex.  Per a telephone encounter in the chart, nursing staff from the emergency department had attempted to contact the patient to discuss urine culture and need to change therapy but was unable to do so.  It does not appear that the medication was ever sent to his pharmacy or a letter or MyChart message created.  Patient states he never received any sort of communication regarding this change.  Will initiate Macrobid x2 weeks while awaiting new urine culture and discussed with patient that we will reach out if any  changes are warranted based on the new urine culture.  Push fluids, follow-up with worsening symptoms.  Final Clinical Impressions(s) / UC Diagnoses   Final diagnoses:  Acute lower UTI  Gross hematuria     Discharge Instructions      Your urine culture is pending.  Take the full course of antibiotics unless instructed otherwise.  We will reach out to you if we need to make any changes based on your urine culture results.  Drink plenty of fluids, empty your bladder fully and frequently.  Follow-up if your symptoms continue to worsen and are not improving.     ED Prescriptions     Medication Sig Dispense Auth. Provider   nitrofurantoin, macrocrystal-monohydrate, (MACROBID) 100 MG capsule Take 1 capsule (100 mg total) by mouth 2 (two) times daily. 28 capsule Particia Nearing, [place]      PDMP not reviewed this encounter.   Particia Nearing, [place] 02/13/21 1456

## 2021-02-13 NOTE — ED Triage Notes (Signed)
Pt presents with blood in urine and dysuria. States completed antibiotic given on 08/01 and 2-3 days after symptoms returned.

## 2021-02-13 NOTE — Discharge Instructions (Addendum)
Your urine culture is pending.  Take the full course of antibiotics unless instructed otherwise.  We will reach out to you if we need to make any changes based on your urine culture results.  Drink plenty of fluids, empty your bladder fully and frequently.  Follow-up if your symptoms continue to worsen and are not improving.

## 2021-02-15 LAB — URINE CULTURE: Culture: >=100000 — AB

## 2021-03-10 ENCOUNTER — Other Ambulatory Visit: Payer: Self-pay | Admitting: Family Medicine

## 2021-04-06 ENCOUNTER — Other Ambulatory Visit: Payer: Self-pay | Admitting: Family Medicine

## 2021-04-06 DIAGNOSIS — E782 Mixed hyperlipidemia: Secondary | ICD-10-CM

## 2021-04-13 ENCOUNTER — Other Ambulatory Visit (HOSPITAL_COMMUNITY): Payer: Self-pay | Admitting: Urology

## 2021-04-13 DIAGNOSIS — B999 Unspecified infectious disease: Secondary | ICD-10-CM

## 2021-04-15 ENCOUNTER — Ambulatory Visit (HOSPITAL_COMMUNITY)
Admission: RE | Admit: 2021-04-15 | Discharge: 2021-04-15 | Disposition: A | Discharge status: Home / Self Care | Payer: 59 | Source: Ambulatory Visit | Attending: Urology | Admitting: Urology

## 2021-04-15 DIAGNOSIS — B999 Unspecified infectious disease: Secondary | ICD-10-CM

## 2021-04-15 MED ORDER — LIDOCAINE HCL 1 % IJ SOLN
INTRAMUSCULAR | Status: AC
  Administered 2021-04-15: 2 mL via SUBCUTANEOUS
  Filled 2021-04-15: qty 20

## 2021-04-15 MED ORDER — HEPARIN SOD (PORK) LOCK FLUSH 100 UNIT/ML IV SOLN
INTRAVENOUS | Status: AC
  Administered 2021-04-15: 5 [IU]
  Filled 2021-04-15: qty 5

## 2021-04-15 NOTE — Procedures (Signed)
Successful placement of 5cc single lumen PICC line to right basilic vein. Length 45cm Tip at lower SVC/RA PICC capped No complications Ready for use.  EBL < 5 mL  Procedure performed under direct supervision5  Elowen Debruyn PA-C 04/15/2021 9:21 AM

## 2021-06-04 ENCOUNTER — Ambulatory Visit: Payer: 59 | Attending: Internal Medicine

## 2021-06-04 ENCOUNTER — Other Ambulatory Visit (HOSPITAL_BASED_OUTPATIENT_CLINIC_OR_DEPARTMENT_OTHER): Payer: Self-pay

## 2021-06-04 DIAGNOSIS — Z23 Encounter for immunization: Secondary | ICD-10-CM

## 2021-06-04 MED ORDER — PFIZER COVID-19 VAC BIVALENT 30 MCG/0.3ML IM SUSP
INTRAMUSCULAR | 0 refills | Status: DC
  Filled 2021-06-04: qty 0.3, 1d supply, fill #0

## 2021-06-04 NOTE — Progress Notes (Signed)
   Covid-19 Vaccination Clinic  Name:  Cody Simmons    MRN: [id number] DOB: [birth year]  06/04/2021  [name] was observed post Covid-19 immunization for 15 minutes without incident. He was provided with Vaccine Information Sheet and instruction to access the V-Safe system.   [name] was instructed to call 911 with any severe reactions post vaccine: Difficulty breathing  Swelling of face and throat  A fast heartbeat  A bad rash all over body  Dizziness and weakness   Immunizations Administered     Name Date Dose VIS Date Route   Pfizer Covid-19 Vaccine Bivalent Booster 06/04/2021  1:58 PM 0.3 mL 03/04/2021 Intramuscular   Manufacturer: [employer], [employer]   Lot: [id number]   NDC: [phone]

## 2021-06-07 ENCOUNTER — Other Ambulatory Visit (INDEPENDENT_AMBULATORY_CARE_PROVIDER_SITE_OTHER): Payer: Self-pay | Admitting: Ophthalmology

## 2021-08-05 ENCOUNTER — Other Ambulatory Visit: Payer: Self-pay | Admitting: Family Medicine

## 2021-08-05 DIAGNOSIS — E782 Mixed hyperlipidemia: Secondary | ICD-10-CM

## 2021-08-14 ENCOUNTER — Encounter: Payer: Self-pay | Admitting: Family Medicine

## 2021-08-14 ENCOUNTER — Ambulatory Visit (INDEPENDENT_AMBULATORY_CARE_PROVIDER_SITE_OTHER): Payer: 59 | Admitting: Family Medicine

## 2021-08-14 VITALS — BP 102/72 | HR 103 | Temp 98.7°F | Ht 69.5 in | Wt 198.8 lb

## 2021-08-14 DIAGNOSIS — Z0000 Encounter for general adult medical examination without abnormal findings: Secondary | ICD-10-CM

## 2021-08-14 DIAGNOSIS — R944 Abnormal results of kidney function studies: Secondary | ICD-10-CM

## 2021-08-14 DIAGNOSIS — E782 Mixed hyperlipidemia: Secondary | ICD-10-CM

## 2021-08-14 DIAGNOSIS — I10 Essential (primary) hypertension: Secondary | ICD-10-CM | POA: Diagnosis not present

## 2021-08-14 DIAGNOSIS — K219 Gastro-esophageal reflux disease without esophagitis: Secondary | ICD-10-CM

## 2021-08-14 DIAGNOSIS — G629 Polyneuropathy, unspecified: Secondary | ICD-10-CM

## 2021-08-14 DIAGNOSIS — E538 Deficiency of other specified B group vitamins: Secondary | ICD-10-CM

## 2021-08-14 DIAGNOSIS — G4733 Obstructive sleep apnea (adult) (pediatric): Secondary | ICD-10-CM

## 2021-08-14 NOTE — Progress Notes (Signed)
Cody Simmons DOB: [birth year] Encounter date: 08/14/2021  This is a 61 y.o. male who presents for complete physical   History of present illness/Additional concerns: Last visit with me was 05/28/2020 for complete physical. No specific worries today.   Right hand and left hand numbness that increases from 5th to thumb. Seems to worsen during day. No weakness. No improvement after carpal tunnel surgery.   Had diarrhea, then UTI. Ended up going to ER. Clearedup for a few days, then seemed like it cameback. Then went to urgent care again. Same thing. Referred to urology - ended up getting IV treatment for a couple of weeks and diagnosed with bacterial prostatitis. No difficulties with emptying bladder now. Not waking typically at night to urinate.   Hypertension: Lisinopril 20 mg daily - taking this at night.  Sleep apnea: not using machine. Dx several years ago. Did class, tried machine, but he is stomach sleeper so it didn't work. Just decided it wasn't worth it. Was mild at time of exam 2017.  GERD: does ok if not eating late. Has adjustable bed.  Hyperlipidemia: pravastatin 40mg  daily. Macular degeneration left eye, retinal necrosis left eye: Following with Dr. Zadie Rhine. Has follow up this month with ophth. Left eye is stable. 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  B12 deficiency: Supplements p.o.  repeat colonoscopy 03/2022 due to fam hx Past Medical History:  Diagnosis Date   Allergy    Diverticulosis of colon (without mention of hemorrhage)    GERD (gastroesophageal reflux disease)    Hiatal hernia    Hypertension    Nephrolithiasis    Retinal necrosis, acute, left    Sleep apnea    "mild" per pt./does not use CPAP   Spinal stenosis    saw [place] [facility] in 2015 s/p epidural inj   Past Surgical History:  Procedure Laterality Date   CATARACT EXTRACTION     COLONOSCOPY  2008,2019   w/[facility]   RETINAL LASER PROCEDURE     Allergies  Allergen Reactions   Shellfish Allergy  Anaphylaxis   Current Meds  Medication Sig   COVID-19 mRNA bivalent vaccine, Pfizer, (PFIZER COVID-19 VAC BIVALENT) injection Inject into the muscle.   COVID-19 mRNA Vac-TriS, Pfizer, (PFIZER-BIONT COVID-19 VAC-TRIS) SUSP injection Inject into the muscle.   famciclovir (FAMVIR) 500 MG tablet TAKE 1 TABLET BY MOUTH  DAILY   folic acid (FOLATE) A999333 MCG tablet Take 1 tablet (400 mcg total) by mouth daily.   ketorolac (ACULAR) 0.5 % ophthalmic solution INSTILL 1 DROP INTO THE  LEFT EYE TWICE DAILY   lisinopril (ZESTRIL) 20 MG tablet TAKE 1 TABLET BY MOUTH  DAILY   loteprednol (LOTEMAX) 0.5 % ophthalmic suspension Place 1 drop into the left eye daily.    pravastatin (PRAVACHOL) 40 MG tablet TAKE 1 TABLET BY MOUTH  DAILY   vitamin B-12 (CYANOCOBALAMIN) 500 MCG tablet Take 500 mcg by mouth daily.   Current Facility-Administered Medications for the 08/14/21 encounter (Office Visit) with Caren Macadam, MD  Medication   0.9 %  sodium chloride infusion   Social History   Tobacco Use   Smoking status: Never   Smokeless tobacco: Never  Substance Use Topics   Alcohol use: Yes    Comment: occasional   Family History  Problem Relation Age of Onset   Hypertension Mother    Kidney cancer Mother    Colon cancer Mother 67   Stroke Mother        came after cancer dx and thought  associated   Hypertension Father    Hyperlipidemia Father    Obesity Brother    Drug abuse Brother    High blood pressure Maternal Grandmother    High blood pressure Maternal Grandfather    Diabetes Maternal Grandfather    Glaucoma Other      Review of Systems  Constitutional:  Negative for activity change, appetite change, chills, fatigue, fever and unexpected weight change.  HENT:  Negative for congestion, ear pain, hearing loss, sinus pressure, sinus pain, sore throat and trouble swallowing.   Eyes:  Negative for pain and visual disturbance.  Respiratory:  Negative for cough, chest tightness, shortness of  breath and wheezing.   Cardiovascular:  Negative for chest pain, palpitations and leg swelling.  Gastrointestinal:  Negative for abdominal distention, abdominal pain, blood in stool, constipation, diarrhea, nausea and vomiting.  Genitourinary:  Negative for decreased urine volume, difficulty urinating, dysuria, penile pain and testicular pain.  Musculoskeletal:  Negative for arthralgias, back pain and joint swelling.  Skin:  Negative for rash.  Neurological:  Positive for numbness (bilat hands). Negative for dizziness, weakness and headaches.  Hematological:  Negative for adenopathy. Does not bruise/bleed easily.  Psychiatric/Behavioral:  Negative for agitation, sleep disturbance and suicidal ideas. The patient is not nervous/anxious.    CBC:  Lab Results  Component Value Date   WBC 6.0 08/14/2021   HGB 17.5 (H) 08/14/2021   HCT 50.0 08/14/2021   MCH 31.3 08/14/2021   MCHC 35.0 08/14/2021   RDW 13.5 08/14/2021   PLT 215 08/14/2021   MPV 10.0 08/14/2021   CMP: Lab Results  Component Value Date   NA 140 08/14/2021   K 4.2 08/14/2021   CL 106 08/14/2021   CO2 24 08/14/2021   ANIONGAP 8 12/17/2017   GLUCOSE 73 08/14/2021   BUN 15 08/14/2021   CREATININE 1.64 (H) 08/14/2021   GFRAA 45 (L) 12/17/2017   CALCIUM 9.3 08/14/2021   PROT 7.0 08/14/2021   BILITOT 0.6 08/14/2021   ALKPHOS 85 07/09/2020   ALT 21 08/14/2021   AST 18 08/14/2021   LIPID: Lab Results  Component Value Date   CHOL 149 08/14/2021   TRIG 320 (H) 08/14/2021   HDL 36 (L) 08/14/2021   LDLCALC 74 08/14/2021    Objective:  BP 102/72 (BP Location: Left Arm, Patient Position: Sitting, Cuff Size: Large)    Pulse (!) 103    Temp 98.7 F (37.1 C) (Oral)    Ht 5' 9.5" (1.765 m)    Wt 198 lb 12.8 oz (90.2 kg)    SpO2 97%    BMI 28.94 kg/m   Weight: 198 lb 12.8 oz (90.2 kg)   BP Readings from Last 3 Encounters:  08/14/21 102/72  02/13/21 113/78  02/02/21 128/81   Wt Readings from Last 3 Encounters:   08/14/21 198 lb 12.8 oz (90.2 kg)  02/02/21 181 lb 3.2 oz (82.2 kg)  01/30/21 178 lb (80.7 kg)    Physical Exam Constitutional:      General: He is not in acute distress.    Appearance: He is well-developed.  HENT:     Head: Normocephalic and atraumatic.     Right Ear: External ear normal.     Left Ear: External ear normal.     Nose: Nose normal.     Mouth/Throat:     Pharynx: No oropharyngeal exudate.  Eyes:     Conjunctiva/sclera: Conjunctivae normal.     Pupils: Pupils are equal, round, and reactive to light.  Neck:     Thyroid: No thyromegaly.  Cardiovascular:     Rate and Rhythm: Normal rate and regular rhythm.     Heart sounds: Normal heart sounds. No murmur heard.   No friction rub. No gallop.  Pulmonary:     Effort: Pulmonary effort is normal. No respiratory distress.     Breath sounds: Normal breath sounds. No stridor. No wheezing or rales.  Abdominal:     General: Bowel sounds are normal.     Palpations: Abdomen is soft.  Musculoskeletal:        General: Normal range of motion.     Cervical back: Neck supple.  Skin:    General: Skin is warm and dry.     Comments: Right lower back 2cm soft mass, mobile, feels consistent with lipoma.  Neurological:     Mental Status: He is alert and oriented to person, place, and time.  Psychiatric:        Behavior: Behavior normal.        Thought Content: Thought content normal.        Judgment: Judgment normal.    Assessment/Plan: There are no preventive care reminders to display for this patient. Health Maintenance reviewed.  1. Preventative health care Keep up with healthy eating, regular exercise.   2. Essential hypertension Well controlled. Continue with current medication.  - CBC with Differential/Platelet; Future - Comprehensive metabolic panel; Future - Comprehensive metabolic panel - CBC with Differential/Platelet  3. OSA (obstructive sleep apnea) This was mild when tested and he is belly sleeper, so  really not able to tolerate traditional mask/machine. Energy level is good, bp well controlled. We will continue to monitor and if concerns for change in baseline or worsening of quality of sleep, can consider additional eval to determine if any other intervention could be helpful.   4. Gastroesophageal reflux disease, unspecified whether esophagitis present Diet controlled.   5. Mixed hyperlipidemia Continue with pravastatin 40, recheck labs today. - Comprehensive metabolic panel; Future - Lipid panel; Future - Lipid panel - Comprehensive metabolic panel  6. B12 deficiency Has been supplementing. Recheck levels.  - Vitamin B12; Future - Folate; Future - Folate - Vitamin B12  7. Neuropathy On chart review, he did get EMG through [facility] in fall 2020 when under evaluation and treatment for hand (prior to his carpal tunnel surgery). Will request these notes. - Heavy Metals Panel, Blood; Future - Vitamin B1; Future - Vitamin B1 - Heavy Metals Panel, Blood   Return for pending lab or imaging results.  Micheline Rough, MD

## 2021-08-18 ENCOUNTER — Telehealth: Payer: Self-pay | Admitting: *Deleted

## 2021-08-18 ENCOUNTER — Encounter (INDEPENDENT_AMBULATORY_CARE_PROVIDER_SITE_OTHER): Payer: 59 | Admitting: Ophthalmology

## 2021-08-18 NOTE — Telephone Encounter (Signed)
-----   Message from Wynn Banker, MD sent at 08/15/2021  2:55 PM EST ----- Per [facility] notes, patient had EMG on his upper extremities back in fall 2020. Can we get these results? He continues to have numbness and I wanted these to help determine next best step.

## 2021-08-18 NOTE — Telephone Encounter (Signed)
Spoke with Janine at [facility] [phone]) and she stated she will fax the EMG report to our office.

## 2021-08-22 LAB — VITAMIN B12: Vitamin B-12: 1062 pg/mL (ref 200–1100)

## 2021-08-22 LAB — COMPREHENSIVE METABOLIC PANEL
AG Ratio: 1.8 (calc) (ref 1.0–2.5)
ALT: 21 U/L (ref 9–46)
AST: 18 U/L (ref 10–35)
Albumin: 4.5 g/dL (ref 3.6–5.1)
Alkaline phosphatase (APISO): 76 U/L (ref 35–144)
BUN/Creatinine Ratio: 9 (calc) (ref 6–22)
BUN: 15 mg/dL (ref 7–25)
CO2: 24 mmol/L (ref 20–32)
Calcium: 9.3 mg/dL (ref 8.6–10.3)
Chloride: 106 mmol/L (ref 98–110)
Creat: 1.64 mg/dL — ABNORMAL HIGH (ref 0.70–1.35)
Globulin: 2.5 g/dL (calc) (ref 1.9–3.7)
Glucose, Bld: 73 mg/dL (ref 65–99)
Potassium: 4.2 mmol/L (ref 3.5–5.3)
Sodium: 140 mmol/L (ref 135–146)
Total Bilirubin: 0.6 mg/dL (ref 0.2–1.2)
Total Protein: 7 g/dL (ref 6.1–8.1)

## 2021-08-22 LAB — CBC WITH DIFFERENTIAL/PLATELET
Absolute Monocytes: 468 cells/uL (ref 200–950)
Basophils Absolute: 30 cells/uL (ref 0–200)
Basophils Relative: 0.5 %
Eosinophils Absolute: 42 cells/uL (ref 15–500)
Eosinophils Relative: 0.7 %
HCT: 50 % (ref 38.5–50.0)
Hemoglobin: 17.5 g/dL — ABNORMAL HIGH (ref 13.2–17.1)
Lymphs Abs: 2160 cells/uL (ref 850–3900)
MCH: 31.3 pg (ref 27.0–33.0)
MCHC: 35 g/dL (ref 32.0–36.0)
MCV: 89.4 fL (ref 80.0–100.0)
MPV: 10 fL (ref 7.5–12.5)
Monocytes Relative: 7.8 %
Neutro Abs: 3300 cells/uL (ref 1500–7800)
Neutrophils Relative %: 55 %
Platelets: 215 10*3/uL (ref 140–400)
RBC: 5.59 10*6/uL (ref 4.20–5.80)
RDW: 13.5 % (ref 11.0–15.0)
Total Lymphocyte: 36 %
WBC: 6 10*3/uL (ref 3.8–10.8)

## 2021-08-22 LAB — LIPID PANEL
Cholesterol: 149 mg/dL (ref <200)
HDL: 36 mg/dL — ABNORMAL LOW (ref >=40)
LDL Cholesterol (Calc): 74 mg/dL (calc)
Non-HDL Cholesterol (Calc): 113 mg/dL (calc) (ref <130)
Total CHOL/HDL Ratio: 4.1 (calc) (ref <5.0)
Triglycerides: 320 mg/dL — ABNORMAL HIGH (ref <150)

## 2021-08-22 LAB — VITAMIN B1: Vitamin B1 (Thiamine): 12 nmol/L (ref 8–30)

## 2021-08-22 LAB — FOLATE: Folate: 17.2 ng/mL

## 2021-08-22 LAB — HEAVY METALS PANEL, BLOOD

## 2021-08-25 ENCOUNTER — Encounter (INDEPENDENT_AMBULATORY_CARE_PROVIDER_SITE_OTHER): Payer: Self-pay | Admitting: Ophthalmology

## 2021-08-25 ENCOUNTER — Other Ambulatory Visit: Payer: Self-pay

## 2021-08-25 ENCOUNTER — Ambulatory Visit (INDEPENDENT_AMBULATORY_CARE_PROVIDER_SITE_OTHER): Payer: 59 | Admitting: Ophthalmology

## 2021-08-25 DIAGNOSIS — H2511 Age-related nuclear cataract, right eye: Secondary | ICD-10-CM

## 2021-08-25 DIAGNOSIS — Z9889 Other specified postprocedural states: Secondary | ICD-10-CM

## 2021-08-25 DIAGNOSIS — H35352 Cystoid macular degeneration, left eye: Secondary | ICD-10-CM

## 2021-08-25 DIAGNOSIS — H30132 Disseminated chorioretinal inflammation, generalized, left eye: Secondary | ICD-10-CM

## 2021-08-25 MED ORDER — FAMCICLOVIR 500 MG PO TABS: 500.0000 mg | ORAL_TABLET | Freq: Every day | ORAL | 12 refills | Status: AC

## 2021-08-25 NOTE — Assessment & Plan Note (Signed)
Nuclear sclerotic cataract slight progression

## 2021-08-25 NOTE — Progress Notes (Signed)
08/25/2021     CHIEF COMPLAINT Patient presents for No chief complaint on file.     HISTORY OF PRESENT ILLNESS: Cody Simmons is a 61 y.o. male who presents to the clinic today for:   HPI   1 yr fu for Acute retinal necrosis, OS. OCT, FP. Patient states vision is stable and unchanged since last visit. Denies any new floaters or FOL. Pt states he uses Ketorolac OS BID, and Loteprednol OS QD.  Last edited by Nelva NayKronstein, Anna N on 08/25/2021  1:14 PM.      Referring physician: Wynn BankerKoberlein, Junell C, MD [address] [place],  [place] [id number]  HISTORICAL INFORMATION:   Selected notes from the MEDICAL RECORD NUMBER    Lab Results  Component Value Date   HGBA1C 5.5 11/29/2017     CURRENT MEDICATIONS: Current Outpatient Medications (Ophthalmic Drugs)  Medication Sig   ketorolac (ACULAR) 0.5 % ophthalmic solution INSTILL 1 DROP INTO THE  LEFT EYE TWICE DAILY   loteprednol (LOTEMAX) 0.5 % ophthalmic suspension Place 1 drop into the left eye daily.    No current facility-administered medications for this visit. (Ophthalmic Drugs)   Current Outpatient Medications (Other)  Medication Sig   COVID-19 mRNA bivalent vaccine, Pfizer, (PFIZER COVID-19 VAC BIVALENT) injection Inject into the muscle.   COVID-19 mRNA Vac-TriS, Pfizer, (PFIZER-BIONT COVID-19 VAC-TRIS) SUSP injection Inject into the muscle.   famciclovir (FAMVIR) 500 MG tablet TAKE 1 TABLET BY MOUTH  DAILY   folic acid (FOLATE) 400 MCG tablet Take 1 tablet (400 mcg total) by mouth daily.   lisinopril (ZESTRIL) 20 MG tablet TAKE 1 TABLET BY MOUTH  DAILY   pravastatin (PRAVACHOL) 40 MG tablet TAKE 1 TABLET BY MOUTH  DAILY   vitamin B-12 (CYANOCOBALAMIN) 500 MCG tablet Take 500 mcg by mouth daily.   Current Facility-Administered Medications (Other)  Medication Route   0.9 %  sodium chloride infusion Intravenous      REVIEW OF SYSTEMS: ROS   Negative for: Constitutional, Gastrointestinal, Neurological, Skin,  Genitourinary, Musculoskeletal, HENT, Endocrine, Cardiovascular, Eyes, Respiratory, Psychiatric, Allergic/Imm, Heme/Lymph Last edited by Edmon Crapeankin, Brittin Janik A, MD on 08/25/2021  1:45 PM.       ALLERGIES Allergies  Allergen Reactions   Shellfish Allergy Anaphylaxis    PAST MEDICAL HISTORY Past Medical History:  Diagnosis Date   Allergy    Diverticulosis of colon (without mention of hemorrhage)    GERD (gastroesophageal reflux disease)    Hiatal hernia    Hypertension    Nephrolithiasis    Retinal necrosis, acute, left    Sleep apnea    "mild" per pt./does not use CPAP   Spinal stenosis    saw [facility] in 2015 s/p epidural inj   Past Surgical History:  Procedure Laterality Date   CATARACT EXTRACTION     COLONOSCOPY  2008,2019   w/[facility]   RETINAL LASER PROCEDURE      FAMILY HISTORY Family History  Problem Relation Age of Onset   Hypertension Mother    Kidney cancer Mother    Colon cancer Mother [id number]   Stroke Mother        came after cancer dx and thought associated   Hypertension Father    Hyperlipidemia Father    Obesity Brother    Drug abuse Brother    High blood pressure Maternal Grandmother    High blood pressure Maternal Grandfather    Diabetes Maternal Grandfather    Glaucoma Other     SOCIAL HISTORY Social History  Tobacco Use   Smoking status: Never   Smokeless tobacco: Never  Vaping Use   Vaping Use: Never used  Substance Use Topics   Alcohol use: Yes    Comment: occasional   Drug use: No         OPHTHALMIC EXAM:  Base Eye Exam     Visual Acuity (ETDRS)       Right Left   Dist cc 20/20 20/125   Dist ph cc  NI    Correction: Glasses         Tonometry (Tonopen, 1:16 PM)       Right Left   Pressure 5 6         Pupils       Pupils Dark Light Shape APD   Right PERRL 3 2 Round None   Left PERRL 3 2 Round None         Visual Fields (Counting fingers)       Left Right     Full   Restrictions Total inferior nasal  deficiency          Extraocular Movement       Right Left    Full Full         Neuro/Psych     Oriented x3: Yes   Mood/Affect: Normal         Dilation     Both eyes: 1.0% Mydriacyl, 2.5% Phenylephrine @ 1:16 PM           Slit Lamp and Fundus Exam     External Exam       Right Left   External Normal Normal         Slit Lamp Exam       Right Left   Lids/Lashes Normal Normal   Conjunctiva/Sclera White and quiet White and quiet   Cornea Clear Clear   Anterior Chamber Deep and quiet Deep and quiet   Iris Round and reactive Dilates poorly   Lens 1+ Nuclear sclerosis Centered posterior chamber intraocular lens   Anterior Vitreous Normal Normal         Fundus Exam       Right Left   Posterior Vitreous Normal Clear, avitric   Disc Normal    C/D Ratio 0.3 0.0, old FPD   Macula Normal    Vessels Normal    Periphery Normal,  No signs of retinitis or vasculitis 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 Scan locations included subfoveal. Central Foveal Thickness: 247. Findings include vitreomacular adhesion .   Left Eye Quality was good. Scan locations included subfoveal. Central Foveal Thickness: 220.   Notes Normal findings right eye, incidental vitreomacular adhesion.  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  Now quiescent and maintain stability for over 18 years on oral suppressive famciclovir     Color Fundus Photography Optos - OU - Both Eyes       Right Eye Progression has been stable. Disc findings include normal observations. Macula : normal observations. Vessels : normal observations. Periphery : normal observations.   Left Eye Progression has been stable. Disc findings include pallor.    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 Acute retinal necrosis, left eye  H30.132 OCT, Retina - OU - Both Eyes    Color Fundus Photography Optos - OU - Both Eyes    2. Cystoid macular edema of left eye  H35.352     3. Age-related nuclear cataract of right eye  H25.11     4. Age-related nuclear cataract, right  H25.11     5. History of vitrectomy  Z98.890       1.  OS stable condition, no recurrences of retinal necrosis  2.  OD no new findings  3.  Ophthalmic Meds Ordered this visit:  No orders of the defined types were placed in this encounter.      Return in about 1 year (around 08/25/2022) for DILATE OU, COLOR FP.  There are no Patient Instructions on file for this visit.   Explained the diagnoses, plan, and follow up with the patient and they expressed understanding.  Patient expressed understanding of the importance of proper follow up care.   Clent Demark Onalee Steinbach M.D. Diseases & Surgery of the Retina and Vitreous [facility] 08/25/21     Abbreviations: M myopia  (nearsighted); A astigmatism; H hyperopia (farsighted); P presbyopia; Mrx spectacle prescription;  CTL contact lenses; OD right eye; OS left eye; OU both eyes  XT exotropia; ET esotropia; PEK punctate epithelial keratitis; PEE punctate epithelial erosions; DES dry eye syndrome; MGD meibomian gland dysfunction; ATs artificial tears; PFAT's preservative free artificial tears; [place] nuclear sclerotic cataract; PSC posterior subcapsular cataract; ERM epi-retinal membrane; PVD posterior vitreous detachment; RD retinal detachment; DM diabetes mellitus; DR diabetic retinopathy; NPDR non-proliferative diabetic retinopathy; PDR proliferative diabetic retinopathy; CSME clinically significant macular edema; DME diabetic macular edema; dbh dot blot hemorrhages; CWS cotton wool spot; POAG primary open angle glaucoma; C/D cup-to-disc ratio; HVF humphrey visual field; GVF goldmann visual field; OCT optical coherence tomography; IOP intraocular pressure; BRVO Branch retinal vein occlusion; CRVO central retinal vein occlusion; CRAO central retinal artery occlusion; BRAO branch retinal artery occlusion; RT retinal tear; SB scleral buckle; PPV pars plana vitrectomy; VH Vitreous hemorrhage; PRP panretinal laser photocoagulation; IVK intravitreal kenalog; VMT vitreomacular traction; MH Macular hole;  NVD neovascularization of the disc; NVE neovascularization elsewhere; AREDS age related eye disease study; ARMD age related macular degeneration; POAG primary open angle glaucoma; EBMD epithelial/anterior basement membrane dystrophy; ACIOL anterior chamber intraocular lens; IOL intraocular lens; PCIOL posterior chamber intraocular lens; Phaco/IOL phacoemulsification with intraocular lens placement; [place] photorefractive keratectomy; LASIK laser assisted in situ keratomileusis; HTN hypertension; DM diabetes mellitus; COPD chronic obstructive pulmonary disease

## 2021-08-25 NOTE — Assessment & Plan Note (Signed)
Nearly 20 years post severe acute retinal necrosis responsive to oral antivirals at the time and now on chronic suppressive famciclovir for a lifetime 500 mg daily  No active disease or has recurrent left eye and most importantly none has not developed in the right eye

## 2021-08-25 NOTE — Assessment & Plan Note (Signed)
Resolved OS 

## 2021-08-25 NOTE — Assessment & Plan Note (Signed)
Nuclear sclerotic cataract slight progression, and nighttime visual difficulties discussed as a future problem and he will follow his own symptoms

## 2021-08-25 NOTE — Assessment & Plan Note (Signed)
Stable over time. 

## 2021-08-27 MED ORDER — ROSUVASTATIN CALCIUM 10 MG PO TABS: 10.0000 mg | ORAL_TABLET | Freq: Every day | ORAL | 1 refills | Status: DC

## 2021-08-27 NOTE — Addendum Note (Signed)
Addended by: Johnella Moloney on: 08/27/2021 03:57 PM   Modules accepted: Orders

## 2021-09-04 ENCOUNTER — Other Ambulatory Visit: Payer: Self-pay | Admitting: Family Medicine

## 2021-09-04 DIAGNOSIS — G629 Polyneuropathy, unspecified: Secondary | ICD-10-CM

## 2021-09-07 ENCOUNTER — Other Ambulatory Visit: Payer: Self-pay | Admitting: Family Medicine

## 2021-09-07 MED ORDER — LISINOPRIL 20 MG PO TABS: 20.0000 mg | ORAL_TABLET | Freq: Every day | ORAL | 1 refills | Status: DC

## 2021-09-16 ENCOUNTER — Other Ambulatory Visit (INDEPENDENT_AMBULATORY_CARE_PROVIDER_SITE_OTHER): Payer: Self-pay | Admitting: Ophthalmology

## 2021-09-16 ENCOUNTER — Other Ambulatory Visit: Payer: Self-pay | Admitting: Family Medicine

## 2021-09-25 ENCOUNTER — Other Ambulatory Visit (INDEPENDENT_AMBULATORY_CARE_PROVIDER_SITE_OTHER): Payer: 59

## 2021-09-25 DIAGNOSIS — G629 Polyneuropathy, unspecified: Secondary | ICD-10-CM

## 2021-09-25 DIAGNOSIS — E782 Mixed hyperlipidemia: Secondary | ICD-10-CM

## 2021-09-25 DIAGNOSIS — R944 Abnormal results of kidney function studies: Secondary | ICD-10-CM

## 2021-09-25 LAB — LIPID PANEL
Cholesterol: 129 mg/dL (ref 0–200)
HDL: 34.4 mg/dL — ABNORMAL LOW (ref >39.00)
LDL Cholesterol: 61 mg/dL (ref 0–99)
NonHDL: 94.39
Total CHOL/HDL Ratio: 4
Triglycerides: 168 mg/dL — ABNORMAL HIGH (ref 0.0–149.0)
VLDL: 33.6 mg/dL (ref 0.0–40.0)

## 2021-09-25 LAB — BASIC METABOLIC PANEL
BUN: 13 mg/dL (ref 6–23)
CO2: 26 mEq/L (ref 19–32)
Calcium: 9.2 mg/dL (ref 8.4–10.5)
Chloride: 104 mEq/L (ref 96–112)
Creatinine, Ser: 1.46 mg/dL (ref 0.40–1.50)
GFR: 51.27 mL/min — ABNORMAL LOW (ref >60.00)
Glucose, Bld: 75 mg/dL (ref 70–99)
Potassium: 3.7 mEq/L (ref 3.5–5.1)
Sodium: 137 mEq/L (ref 135–145)

## 2021-09-26 LAB — BASIC METABOLIC PANEL WITH GFR
BUN/Creatinine Ratio: 8 (calc) (ref 6–22)
BUN: 13 mg/dL (ref 7–25)
CO2: 25 mmol/L (ref 20–32)
Calcium: 9.4 mg/dL (ref 8.6–10.3)
Chloride: 104 mmol/L (ref 98–110)
Creat: 1.56 mg/dL — ABNORMAL HIGH (ref 0.70–1.35)
Glucose, Bld: 79 mg/dL (ref 65–99)
Potassium: 4.1 mmol/L (ref 3.5–5.3)
Sodium: 138 mmol/L (ref 135–146)
eGFR: 50 mL/min/{1.73_m2} — ABNORMAL LOW (ref >=60)

## 2021-09-30 LAB — HEAVY METALS, BLOOD

## 2021-12-07 ENCOUNTER — Other Ambulatory Visit: Payer: Self-pay | Admitting: *Deleted

## 2021-12-07 ENCOUNTER — Other Ambulatory Visit: Payer: 59

## 2021-12-07 DIAGNOSIS — E782 Mixed hyperlipidemia: Secondary | ICD-10-CM

## 2021-12-07 DIAGNOSIS — G629 Polyneuropathy, unspecified: Secondary | ICD-10-CM

## 2021-12-07 LAB — LIPID PANEL
Cholesterol: 121 mg/dL (ref 0–200)
HDL: 32.3 mg/dL — ABNORMAL LOW (ref >39.00)
NonHDL: 88.77
Total CHOL/HDL Ratio: 4
Triglycerides: 208 mg/dL — ABNORMAL HIGH (ref 0.0–149.0)
VLDL: 41.6 mg/dL — ABNORMAL HIGH (ref 0.0–40.0)

## 2021-12-07 LAB — LDL CHOLESTEROL, DIRECT: Direct LDL: 51 mg/dL

## 2021-12-09 LAB — HEAVY METALS PANEL, BLOOD
Arsenic: <3 mcg/L (ref <23)
Lead: <1 ug/dL (ref <3.5)
Mercury, B: <4 mcg/L (ref <=10)

## 2021-12-18 ENCOUNTER — Other Ambulatory Visit (INDEPENDENT_AMBULATORY_CARE_PROVIDER_SITE_OTHER): Payer: Self-pay | Admitting: Ophthalmology

## 2021-12-23 ENCOUNTER — Other Ambulatory Visit (INDEPENDENT_AMBULATORY_CARE_PROVIDER_SITE_OTHER): Payer: 59 | Admitting: Ophthalmology

## 2021-12-23 MED ORDER — FLUOROMETHOLONE ACETATE 0.1 % OP SUSP: 1.0000 [drp] | OPHTHALMIC | 6 refills | Status: AC

## 2021-12-23 NOTE — Progress Notes (Signed)
Fax notification from [facility] that previous medication loteprednol was no longer available.  Recommending change to FML Fort[place]  New prescription for FML 1 drop left eye every other day, q. OD

## 2022-02-04 ENCOUNTER — Other Ambulatory Visit: Payer: Self-pay | Admitting: *Deleted

## 2022-02-05 MED ORDER — ROSUVASTATIN CALCIUM 10 MG PO TABS: 10.0000 mg | ORAL_TABLET | Freq: Every day | ORAL | 0 refills | Status: DC

## 2022-02-08 ENCOUNTER — Other Ambulatory Visit: Payer: Self-pay | Admitting: *Deleted

## 2022-02-08 MED ORDER — LISINOPRIL 20 MG PO TABS: 20.0000 mg | ORAL_TABLET | Freq: Every day | ORAL | 0 refills | Status: DC

## 2022-03-09 ENCOUNTER — Other Ambulatory Visit: Payer: Self-pay | Admitting: Family Medicine

## 2022-03-10 NOTE — Telephone Encounter (Signed)
Ok to fill for 90 days but he needs an appt to establish with me

## 2022-04-27 ENCOUNTER — Encounter: Payer: Self-pay | Admitting: Gastroenterology

## 2022-06-02 ENCOUNTER — Encounter: Payer: Self-pay | Admitting: Gastroenterology

## 2022-06-24 ENCOUNTER — Ambulatory Visit (AMBULATORY_SURGERY_CENTER): Payer: 59 | Admitting: *Deleted

## 2022-06-24 VITALS — Ht 70.0 in | Wt 195.0 lb

## 2022-06-24 DIAGNOSIS — Z800 Family history of malignant neoplasm of digestive organs: Secondary | ICD-10-CM

## 2022-06-24 MED ORDER — NA SULFATE-K SULFATE-MG SULF 17.5-3.13-1.6 GM/177ML PO SOLN: 1.0000 | Freq: Once | ORAL | 0 refills | Status: AC

## 2022-06-24 NOTE — Progress Notes (Signed)
Pre-visit via telephone Instructions sent via my chart and mail with coupon No egg or soy allergy known to patient  No issues known to pt with past sedation with any surgeries or procedures Patient denies ever being told they had issues or difficulty with intubation  No FH of Malignant Hyperthermia Pt is not on diet pills Pt is not on  home 02  Pt is not on blood thinners  Pt denies issues with constipation  Pt encouraged to use to use Singlecare or Goodrx to reduce cost  Patient's chart reviewed by Cathlyn Parsons CNRA prior to previsit and patient appropriate for the [facility].  Previsit completed and red dot placed by patient's name on their procedure day (on provider's schedule).  Marland Kitchen

## 2022-07-27 ENCOUNTER — Encounter: Payer: Self-pay | Admitting: Gastroenterology

## 2022-07-30 ENCOUNTER — Ambulatory Visit (AMBULATORY_SURGERY_CENTER): Payer: 59 | Admitting: Gastroenterology

## 2022-07-30 ENCOUNTER — Encounter: Payer: Self-pay | Admitting: Gastroenterology

## 2022-07-30 VITALS — BP 125/85 | HR 67 | Temp 97.8°F | Resp 19 | Ht 69.5 in | Wt 195.0 lb

## 2022-07-30 DIAGNOSIS — Z800 Family history of malignant neoplasm of digestive organs: Secondary | ICD-10-CM | POA: Diagnosis not present

## 2022-07-30 DIAGNOSIS — Z1211 Encounter for screening for malignant neoplasm of colon: Secondary | ICD-10-CM

## 2022-07-30 MED ORDER — SODIUM CHLORIDE 0.9 % IV SOLN: 500.0000 mL | Freq: Once | INTRAVENOUS | Status: DC

## 2022-07-30 NOTE — Progress Notes (Signed)
Report to pacu rn. Vss. Care resumed by rn. 

## 2022-07-30 NOTE — Progress Notes (Signed)
History & Physical  Primary Care Physician:  Patient, No Pcp Per Primary Gastroenterologist: Lucio Edward, MD  Impression / Plan:  Family history of colon cancer, first-degree relative, for colonoscopy.  CHIEF COMPLAINT:  [facility]   HPI: Cody Simmons is a 62 y.o. male with a family history of colon cancer, first-degree relative, for colonoscopy.    Past Medical History:  Diagnosis Date   Allergy    Diverticulosis of colon (without mention of hemorrhage)    GERD (gastroesophageal reflux disease)    Hiatal hernia    Hypertension    Nephrolithiasis    Retinal necrosis, acute, left    Sleep apnea    "mild" per pt./does not use CPAP   Spinal stenosis    saw [facility] in 2015 s/p epidural inj    Past Surgical History:  Procedure Laterality Date   CATARACT EXTRACTION     COLONOSCOPY  2008,2019   w/[facility]   RETINAL LASER PROCEDURE      Prior to Admission medications   Medication Sig Start Date End Date Taking? Authorizing Provider  famciclovir (FAMVIR) 500 MG tablet Take 1 tablet (500 mg total) by mouth daily. 08/25/21  Yes Rankin, Clent Demark, MD  fluorometholone (EFLONE) 0.1 % ophthalmic suspension Place 1 drop into the left eye every other day. 12/23/21 12/23/22 Yes Rankin, Clent Demark, MD  folic acid (FOLATE) 132 MCG tablet Take 1 tablet (400 mcg total) by mouth daily. 01/22/19  Yes Koberlein, Junell C, MD  ketorolac (ACULAR) 0.5 % ophthalmic solution INSTILL 1 DROP INTO THE  LEFT EYE TWICE DAILY 12/21/21  Yes Rankin, Clent Demark, MD  lisinopril (ZESTRIL) 20 MG tablet TAKE 1 TABLET BY MOUTH DAILY 03/10/22  Yes Farrel Conners, MD  rosuvastatin (CRESTOR) 10 MG tablet TAKE 1 TABLET BY MOUTH DAILY 03/10/22  Yes Farrel Conners, MD  vitamin B-12 (CYANOCOBALAMIN) 500 MCG tablet Take 500 mcg by mouth daily.   Yes [provider]  cetirizine (ZYRTEC) 10 MG tablet 1 tablet Orally once a day    [provider]    Current Outpatient Medications  Medication Sig Dispense  Refill   famciclovir (FAMVIR) 500 MG tablet Take 1 tablet (500 mg total) by mouth daily. 90 tablet 12   fluorometholone (EFLONE) 0.1 % ophthalmic suspension Place 1 drop into the left eye every other day. 5 mL 6   folic acid (FOLATE) 440 MCG tablet Take 1 tablet (400 mcg total) by mouth daily.     ketorolac (ACULAR) 0.5 % ophthalmic solution INSTILL 1 DROP INTO THE  LEFT EYE TWICE DAILY 15 mL 12   lisinopril (ZESTRIL) 20 MG tablet TAKE 1 TABLET BY MOUTH DAILY 90 tablet 0   rosuvastatin (CRESTOR) 10 MG tablet TAKE 1 TABLET BY MOUTH DAILY 90 tablet 0   vitamin B-12 (CYANOCOBALAMIN) 500 MCG tablet Take 500 mcg by mouth daily.     cetirizine (ZYRTEC) 10 MG tablet 1 tablet Orally once a day     Current Facility-Administered Medications  Medication Dose Route Frequency Provider Last Rate Last Admin   0.9 %  sodium chloride infusion  500 mL Intravenous Once Ladene Artist, MD        Allergies as of 07/30/2022 - Review Complete 07/30/2022  Allergen Reaction Noted   Shellfish allergy Anaphylaxis 11/16/2011    Family History  Problem Relation Age of Onset   Hypertension Mother    Kidney cancer Mother    Colon cancer Mother 74   Stroke Mother  came after cancer dx and thought associated   Hypertension Father    Hyperlipidemia Father    Obesity Brother    Drug abuse Brother    High blood pressure Maternal Grandmother    High blood pressure Maternal Grandfather    Diabetes Maternal Grandfather    Glaucoma Other    Colon polyps Neg Hx    Esophageal cancer Neg Hx    Rectal cancer Neg Hx    Stomach cancer Neg Hx     Social History   Socioeconomic History   Marital status: Married    Spouse name: Not on file   Number of children: Not on file   Years of education: Not on file   Highest education level: Not on file  Occupational History   Occupation: Chemical engineer: UNEMPLOYED  Tobacco Use   Smoking status: Never   Smokeless tobacco: Never  Vaping Use   Vaping  Use: Never used  Substance and Sexual Activity   Alcohol use: Yes    Comment: occasional   Drug use: No   Sexual activity: Not on file  Other Topics Concern   Not on file  Social History Narrative   Work or School: retired, Insurance underwriter Situation: lives with wife      Spiritual Beliefs: Baptist      Lifestyle: no regular; diet is healthy      Social Determinants of Radio broadcast assistant Strain: Not on file  Food Insecurity: Not on file  Transportation Needs: Not on file  Physical Activity: Not on file  Stress: Not on file  Social Connections: Not on file  Intimate Partner Violence: Not on file    Review of Systems:  All systems reviewed were negative except where noted in HPI.   Physical Exam: General:  Alert, well-developed, in NAD Head:  Normocephalic and atraumatic. Eyes:  Sclera clear, no icterus.   Conjunctiva pink. Ears:  Normal auditory acuity. Mouth:  No deformity or lesions.  Neck:  Supple; no masses. Lungs:  Clear throughout to auscultation.   No wheezes, crackles, or rhonchi.  Heart:  Regular rate and rhythm; no murmurs. Abdomen:  Soft, nondistended, nontender. No masses, hepatomegaly. No palpable masses.  Normal bowel sounds.    Rectal:  Deferred   Msk:  Symmetrical without gross deformities. Extremities:  Without edema. Neurologic:  Alert and  oriented x 4; grossly normal neurologically. Skin:  Intact without significant lesions or rashes. Psych:  Alert and cooperative. Normal mood and affect.   Pricilla Riffle. Fuller Plan  07/30/2022, 11:27 AM See Shea Evans, [facility], to contact our on call provider

## 2022-07-30 NOTE — Patient Instructions (Signed)
Thank you for letting us take care of your healthcare needs today. Please see handouts given to you on Diverticulosis and Hemorrhoids.    YOU HAD AN ENDOSCOPIC PROCEDURE TODAY AT THE [facility]:   Refer to the procedure report that was given to you for any specific questions about what was found during the examination.  If the procedure report does not answer your questions, please call your gastroenterologist to clarify.  If you requested that your care partner not be given the details of your procedure findings, then the procedure report has been included in a sealed envelope for you to review at your convenience later.  YOU SHOULD EXPECT: Some feelings of bloating in the abdomen. Passage of more gas than usual.  Walking can help get rid of the air that was put into your GI tract during the procedure and reduce the bloating. If you had a lower endoscopy (such as a colonoscopy or flexible sigmoidoscopy) you may notice spotting of blood in your stool or on the toilet paper. If you underwent a bowel prep for your procedure, you may not have a normal bowel movement for a few days.  Please Note:  You might notice some irritation and congestion in your nose or some drainage.  This is from the oxygen used during your procedure.  There is no need for concern and it should clear up in a day or so.  SYMPTOMS TO REPORT IMMEDIATELY:  Following lower endoscopy (colonoscopy or flexible sigmoidoscopy):  Excessive amounts of blood in the stool  Significant tenderness or worsening of abdominal pains  Swelling of the abdomen that is new, acute  Fever of 100F or higher   For urgent or emergent issues, a gastroenterologist can be reached at any hour by calling [phone]. Do not use MyChart messaging for urgent concerns.    DIET:  We do recommend a small meal at first, but then you may proceed to your regular diet.  Drink plenty of fluids but you should avoid alcoholic beverages for 24  hours.  ACTIVITY:  You should plan to take it easy for the rest of today and you should NOT DRIVE or use heavy machinery until tomorrow (because of the sedation medicines used during the test).    FOLLOW UP: Our staff will call the number listed on your records the next business day following your procedure.  We will call around 7:15- 8:00 am to check on you and address any questions or concerns that you may have regarding the information given to you following your procedure. If we do not reach you, we will leave a message.     If any biopsies were taken you will be contacted by phone or by letter within the next 1-3 weeks.  Please call us at [phone] if you have not heard about the biopsies in 3 weeks.    SIGNATURES/CONFIDENTIALITY: You and/or your care partner have signed paperwork which will be entered into your electronic medical record.  These signatures attest to the fact that that the information above on your After Visit Summary has been reviewed and is understood.  Full responsibility of the confidentiality of this discharge information lies with you and/or your care-partner. 

## 2022-07-30 NOTE — Progress Notes (Signed)
Pt's states no medical or surgical changes since previsit or office visit. 

## 2022-07-30 NOTE — Op Note (Signed)
[place] Patient Name: Jerol Rufener Procedure Date: 07/30/2022 11:19 AM MRN: [id number] Endoscopist: Ladene Artist , MD, [id number] Age: 62 Referring MD:  Date of Birth: March 05, 1961 Gender: Male Account #: [id number] Procedure:                Colonoscopy Indications:              Screening in patient at increased risk: Family                            history of 1st-degree relative with colorectal                            cancer Medicines:                Monitored Anesthesia Care Procedure:                Pre-Anesthesia Assessment:                           - Prior to the procedure, a History and Physical                            was performed, and patient medications and                            allergies were reviewed. The patient's tolerance of                            previous anesthesia was also reviewed. The risks                            and benefits of the procedure and the sedation                            options and risks were discussed with the patient.                            All questions were answered, and informed consent                            was obtained. Prior Anticoagulants: The patient has                            taken no anticoagulant or antiplatelet agents. ASA                            Grade Assessment: II - A patient with mild systemic                            disease. After reviewing the risks and benefits,                            the patient was deemed in satisfactory condition to  undergo the procedure.                           After obtaining informed consent, the colonoscope                            was passed under direct vision. Throughout the                            procedure, the patient's blood pressure, pulse, and                            oxygen saturations were monitored continuously. The                            Olympus CF-[id number] (Serial# [id number]) Colonoscope was                             introduced through the anus and advanced to the the                            cecum, identified by appendiceal orifice and                            ileocecal valve. The ileocecal valve, appendiceal                            orifice, and rectum were photographed. Retroflexed                            view of the rectum did not capture properly. The                            quality of the bowel preparation was good. The                            colonoscopy was performed without difficulty. The                            patient tolerated the procedure well. Scope In: 11:37:19 AM Scope Out: 11:48:22 AM Scope Withdrawal Time: 0 hours 8 minutes 17 seconds  Total Procedure Duration: 0 hours 11 minutes 3 seconds  Findings:                 The perianal and digital rectal examinations were                            normal.                           A few small-mouthed diverticula were found in the                            left colon. There was no evidence of diverticular  bleeding.                           External hemorrhoids were found during                            retroflexion. The hemorrhoids were small.                           The exam was otherwise without abnormality on                            direct and retroflexion views. Complications:            No immediate complications. Estimated blood loss:                            None. Estimated Blood Loss:     Estimated blood loss: none. Impression:               - Mild diverticulosis in the left colon.                           - External hemorrhoids.                           - The examination was otherwise normal on direct                            and retroflexion views.                           - No specimens collected. Recommendation:           - Repeat colonoscopy in 5 years for surveillance.                           - Patient has a contact number available for                             emergencies. The signs and symptoms of potential                            delayed complications were discussed with the                            patient. Return to normal activities tomorrow.                            Written discharge instructions were provided to the                            patient.                           - High fiber diet.                           -  Continue present medications. Meryl Dare, MD 07/30/2022 11:56:44 AM This report has been signed electronically.

## 2022-08-02 ENCOUNTER — Telehealth: Payer: Self-pay

## 2022-08-02 NOTE — Telephone Encounter (Signed)
  Follow up Call-     07/30/2022   10:39 AM  Call back number  Post procedure Call Back phone  # [phone]  Permission to leave phone message Yes     Patient questions:  Do you have a fever, pain , or abdominal swelling? No. Pain Score  0 *  Have you tolerated food without any problems? Yes.    Have you been able to return to your normal activities? Yes.    Do you have any questions about your discharge instructions: Diet   No. Medications  No. Follow up visit  No.  Do you have questions or concerns about your Care? No.  Actions: * If pain score is 4 or above: No action needed, pain <4.

## 2022-08-26 ENCOUNTER — Encounter (INDEPENDENT_AMBULATORY_CARE_PROVIDER_SITE_OTHER): Payer: Self-pay

## 2022-08-26 ENCOUNTER — Encounter (INDEPENDENT_AMBULATORY_CARE_PROVIDER_SITE_OTHER): Payer: 59 | Admitting: Ophthalmology

## 2022-09-06 ENCOUNTER — Other Ambulatory Visit: Payer: Self-pay | Admitting: Family Medicine

## 2022-09-07 ENCOUNTER — Other Ambulatory Visit: Payer: Self-pay | Admitting: Family Medicine

## 2022-09-07 NOTE — Telephone Encounter (Signed)
Pt has not bee seen here in 1 year-- needs appt. If he schedules appt then ok to fill his rx's until his appt date

## 2022-09-10 ENCOUNTER — Ambulatory Visit: Payer: 59 | Admitting: Family Medicine

## 2022-09-10 ENCOUNTER — Encounter: Payer: Self-pay | Admitting: Family Medicine

## 2022-09-10 VITALS — BP 110/64 | HR 90 | Temp 98.3°F | Ht 69.5 in | Wt 190.7 lb

## 2022-09-10 DIAGNOSIS — E782 Mixed hyperlipidemia: Secondary | ICD-10-CM | POA: Diagnosis not present

## 2022-09-10 DIAGNOSIS — I10 Essential (primary) hypertension: Secondary | ICD-10-CM | POA: Diagnosis not present

## 2022-09-10 MED ORDER — LISINOPRIL 20 MG PO TABS: 20.0000 mg | ORAL_TABLET | Freq: Every day | ORAL | 3 refills | Status: DC

## 2022-09-10 MED ORDER — ROSUVASTATIN CALCIUM 10 MG PO TABS: 10.0000 mg | ORAL_TABLET | Freq: Every day | ORAL | 3 refills | Status: DC

## 2022-09-10 NOTE — Progress Notes (Signed)
Established Patient Office Visit  Subjective   Patient ID: Cody Simmons, male    DOB: 1961/06/30  Age: 62 y.o. MRN: [id number]  Chief Complaint  Patient presents with  . Establish Care    HPI Current Outpatient Medications  Medication Instructions  . famciclovir (FAMVIR) 500 mg, Oral, Daily  . fluorometholone (EFLONE) 0.1 % ophthalmic suspension 1 drop, Left Eye, Every other day  . folic acid (FOLATE) [id number] mcg, Oral, Daily  . ketorolac (ACULAR) 0.5 % ophthalmic solution INSTILL 1 DROP INTO THE  LEFT EYE TWICE DAILY  . lisinopril (ZESTRIL) 20 mg, Oral, Daily  . rosuvastatin (CRESTOR) 10 mg, Oral, Daily  . vitamin B-12 (CYANOCOBALAMIN) 500 mcg, Oral, Daily    Patient Active Problem List   Diagnosis Date Noted  . History of vitrectomy 08/25/2021  . Intermediate stage nonexudative age-related macular degeneration of left eye 08/18/2020  . Cystoid macular edema of left eye 08/18/2020  . Acute retinal necrosis, left eye 08/18/2020  . Age-related nuclear cataract, right 08/18/2020  . Cataract, right eye 05/28/2020  . OSA (obstructive sleep apnea) 03/24/2016  . Hypersomnia 01/14/2016  . GERD (gastroesophageal reflux disease) 12/24/2014  . Erectile disorder, acquired, situational, mild 12/24/2014  . MIXED HYPERLIPIDEMIA 06/07/2007  . Essential hypertension 03/17/2007  . Allergic rhinitis 03/17/2007      Review of Systems  All other systems reviewed and are negative.     Objective:     BP 110/64 (BP Location: Left Arm, Patient Position: Sitting, Cuff Size: Large)   Pulse 90   Temp 98.3 F (36.8 C) (Oral)   Ht 5' 9.5" (1.765 m)   Wt 190 lb 11.2 oz (86.5 kg)   SpO2 100%   BMI 27.76 kg/m  BP Readings from Last 3 Encounters:  09/10/22 110/64  07/30/22 125/85  08/14/21 102/72      Physical Exam Vitals reviewed.  Constitutional:      Appearance: Normal appearance. He is well-groomed and normal weight.  Eyes:     Extraocular Movements: Extraocular movements  intact.     Conjunctiva/sclera: Conjunctivae normal.  Neck:     Thyroid: No thyromegaly.  Cardiovascular:     Rate and Rhythm: Normal rate and regular rhythm.     Heart sounds: S1 normal and S2 normal. No murmur heard. Pulmonary:     Effort: Pulmonary effort is normal.     Breath sounds: Normal breath sounds and air entry. No rales.  Abdominal:     General: Abdomen is flat. Bowel sounds are normal.  Musculoskeletal:     Right lower leg: No edema.     Left lower leg: No edema.  Neurological:     General: No focal deficit present.     Mental Status: He is alert and oriented to person, place, and time.     Gait: Gait is intact.  Psychiatric:        Mood and Affect: Mood and affect normal.     No results found for any visits on 09/10/22.  Last lipids Lab Results  Component Value Date   CHOL 121 12/07/2021   HDL 32.30 (L) 12/07/2021   LDLCALC 61 09/25/2021   LDLDIRECT 51.0 12/07/2021   TRIG 208.0 (H) 12/07/2021   CHOLHDL 4 12/07/2021      The ASCVD Risk score (Arnett DK, et al., 2019) failed to calculate for the following reasons:   The valid total cholesterol range is 130 to 320 mg/dL    Assessment & Plan:   Problem List Items  Addressed This Visit       Unprioritized   MIXED HYPERLIPIDEMIA   Relevant Medications   lisinopril (ZESTRIL) 20 MG tablet   rosuvastatin (CRESTOR) 10 MG tablet   Other Relevant Orders   Lipid Panel   Hemoglobin A1c   Essential hypertension - Primary   Relevant Medications   lisinopril (ZESTRIL) 20 MG tablet   rosuvastatin (CRESTOR) 10 MG tablet   Other Relevant Orders   CMP    Return in about 6 months (around 03/13/2023) for annual physical exam.    Farrel Conners, MD

## 2022-09-13 NOTE — Assessment & Plan Note (Signed)
Current hypertension medications:       Sig   lisinopril (ZESTRIL) 20 MG tablet Take 1 tablet (20 mg total) by mouth daily.      BP is well controlled on the above medication, will continue this as prescribed, refills sent to pharmacy

## 2022-09-13 NOTE — Assessment & Plan Note (Signed)
He is due for his annual labs today, rechecking lipid panel, continue crestor 10 mg daily as prescribed. Refills sent to pharmacy.

## 2022-10-12 ENCOUNTER — Encounter: Payer: Self-pay | Admitting: Family Medicine

## 2022-10-12 ENCOUNTER — Ambulatory Visit (INDEPENDENT_AMBULATORY_CARE_PROVIDER_SITE_OTHER): Payer: 59 | Admitting: Family Medicine

## 2022-10-12 VITALS — BP 110/80 | HR 70 | Temp 98.1°F | Ht 70.5 in | Wt 192.5 lb

## 2022-10-12 DIAGNOSIS — I10 Essential (primary) hypertension: Secondary | ICD-10-CM | POA: Diagnosis not present

## 2022-10-12 DIAGNOSIS — E782 Mixed hyperlipidemia: Secondary | ICD-10-CM

## 2022-10-12 DIAGNOSIS — Z0000 Encounter for general adult medical examination without abnormal findings: Secondary | ICD-10-CM

## 2022-10-12 LAB — COMPREHENSIVE METABOLIC PANEL
ALT: 17 U/L (ref 0–53)
AST: 18 U/L (ref 0–37)
Albumin: 4.3 g/dL (ref 3.5–5.2)
Alkaline Phosphatase: 68 U/L (ref 39–117)
BUN: 13 mg/dL (ref 6–23)
CO2: 25 mEq/L (ref 19–32)
Calcium: 9.4 mg/dL (ref 8.4–10.5)
Chloride: 106 mEq/L (ref 96–112)
Creatinine, Ser: 1.5 mg/dL (ref 0.40–1.50)
GFR: 49.27 mL/min — ABNORMAL LOW (ref >60.00)
Glucose, Bld: 88 mg/dL (ref 70–99)
Potassium: 4.2 mEq/L (ref 3.5–5.1)
Sodium: 138 mEq/L (ref 135–145)
Total Bilirubin: 0.6 mg/dL (ref 0.2–1.2)
Total Protein: 7.1 g/dL (ref 6.0–8.3)

## 2022-10-12 LAB — HEMOGLOBIN A1C: Hgb A1c MFr Bld: 5.4 % (ref 4.6–6.5)

## 2022-10-12 LAB — LIPID PANEL
Cholesterol: 113 mg/dL (ref 0–200)
HDL: 32.5 mg/dL — ABNORMAL LOW (ref >39.00)
LDL Cholesterol: 48 mg/dL (ref 0–99)
NonHDL: 80.92
Total CHOL/HDL Ratio: 3
Triglycerides: 163 mg/dL — ABNORMAL HIGH (ref 0.0–149.0)
VLDL: 32.6 mg/dL (ref 0.0–40.0)

## 2022-10-12 NOTE — Progress Notes (Signed)
Complete physical exam  Patient: Cody Simmons   DOB: 11-Jul-1960   62 y.o. Male  MRN: [id number]  Subjective:    Chief Complaint  Patient presents with   Annual Exam    Cody Simmons is a 62 y.o. male who presents today for a complete physical exam. He reports consuming a general diet.  Pt reports occasionally he goes to the gym and does cardio.  He generally feels well. He reports sleeping pt is reporting sleep disruptions, states he is having trouble falling asleep and staying asleep. We reviewed his PHQ score, states that he is going through the [place] system for some "issues" he is having He does not have additional problems to discuss today.    Most recent fall risk assessment:    10/12/2022    8:17 AM  Fall Risk   Falls in the past year? 0  Number falls in past yr: 0  Injury with Fall? 0  Risk for fall due to : No Fall Risks  Follow up Falls evaluation completed     Most recent depression screenings:    10/12/2022    8:17 AM 09/10/2022    3:39 PM  PHQ 2/9 Scores  PHQ - 2 Score 2 0  PHQ- 9 Score 9     Vision:Within last year and Dental: No current dental problems and Receives regular dental care  Patient Active Problem List   Diagnosis Date Noted   History of vitrectomy 08/25/2021   Intermediate stage nonexudative age-related macular degeneration of left eye 08/18/2020   Cystoid macular edema of left eye 08/18/2020   Acute retinal necrosis, left eye 08/18/2020   Age-related nuclear cataract, right 08/18/2020   Cataract, right eye 05/28/2020   OSA (obstructive sleep apnea) 03/24/2016   Hypersomnia 01/14/2016   GERD (gastroesophageal reflux disease) 12/24/2014   Erectile disorder, acquired, situational, mild 12/24/2014   Mixed hyperlipidemia 06/07/2007   Essential hypertension 03/17/2007   Allergic rhinitis 03/17/2007      Patient Care Team: Karie Georges, MD as PCP - General (Family Medicine)   Outpatient Medications Prior to Visit  Medication Sig    famciclovir (FAMVIR) 500 MG tablet Take 1 tablet (500 mg total) by mouth daily.   fluorometholone (EFLONE) 0.1 % ophthalmic suspension Place 1 drop into the left eye every other day.   folic acid (FOLATE) 400 MCG tablet Take 1 tablet (400 mcg total) by mouth daily.   ketorolac (ACULAR) 0.5 % ophthalmic solution INSTILL 1 DROP INTO THE  LEFT EYE TWICE DAILY   lisinopril (ZESTRIL) 20 MG tablet Take 1 tablet (20 mg total) by mouth daily.   rosuvastatin (CRESTOR) 10 MG tablet Take 1 tablet (10 mg total) by mouth daily.   vitamin B-12 (CYANOCOBALAMIN) 500 MCG tablet Take 500 mcg by mouth daily.   No facility-administered medications prior to visit.    Review of Systems  HENT:  Negative for hearing loss.   Eyes:  Negative for blurred vision.  Respiratory:  Negative for shortness of breath.   Cardiovascular:  Negative for chest pain.  Gastrointestinal: Negative.   Genitourinary: Negative.   Musculoskeletal:  Negative for back pain.  Neurological:  Negative for headaches.  Psychiatric/Behavioral:  Negative for depression.   All other systems reviewed and are negative.         Objective:     BP 110/80 (BP Location: Left Arm, Patient Position: Sitting, Cuff Size: Normal)   Pulse 70   Temp 98.1 F (36.7 C) (Oral)  Ht 5' 10.5" (1.791 m)   Wt 192 lb 8 oz (87.3 kg)   SpO2 100%   BMI 27.23 kg/m    Physical Exam Vitals reviewed.  Constitutional:      Appearance: Normal appearance. He is well-groomed and normal weight.  Eyes:     Extraocular Movements: Extraocular movements intact.     Conjunctiva/sclera: Conjunctivae normal.  Neck:     Thyroid: No thyromegaly.  Cardiovascular:     Rate and Rhythm: Normal rate and regular rhythm.     Heart sounds: S1 normal and S2 normal. No murmur heard. Pulmonary:     Effort: Pulmonary effort is normal.     Breath sounds: Normal breath sounds and air entry. No rales.  Abdominal:     General: Abdomen is flat. Bowel sounds are normal.   Musculoskeletal:     Right lower leg: No edema.     Left lower leg: No edema.  Neurological:     General: No focal deficit present.     Mental Status: He is alert and oriented to person, place, and time.     Gait: Gait is intact.  Psychiatric:        Mood and Affect: Mood and affect normal.      No results found for any visits on 10/12/22.     Assessment & Plan:    Routine Health Maintenance and Physical Exam  Immunization History  Administered Date(s) Administered   Influenza,inj,Quad PF,6+ Mos 04/04/2018, 07/09/2020   Influenza-Unspecified 05/06/2015   PFIZER Comirnaty(Gray Top)Covid-19 Tri-Sucrose Vaccine 12/10/2020   PFIZER(Purple Top)SARS-COV-2 Vaccination 09/13/2019, 10/04/2019, 02/21/2020, 06/10/2020   Pfizer Covid-19 Vaccine Bivalent Booster 21yrs & up 06/04/2021   Td 02/03/1999, 12/04/2008   Tdap 10/10/2017   Zoster Recombinat (Shingrix) 07/09/2020, 09/08/2020    Health Maintenance  Topic Date Due   COVID-19 Vaccine (7 - 2023-24 season) 10/28/2022 (Originally 03/05/2022)   INFLUENZA VACCINE  02/03/2023   COLONOSCOPY (Pts 45-27yrs Insurance coverage will need to be confirmed)  07/31/2027   DTaP/Tdap/Td (4 - Td or Tdap) 10/11/2027   Hepatitis C Screening  Completed   HIV Screening  Completed   Zoster Vaccines- Shingrix  Completed   HPV VACCINES  Aged Out    Discussed health benefits of physical activity, and encouraged him to engage in regular exercise appropriate for his age and condition.  Problem List Items Addressed This Visit       Unprioritized   Mixed hyperlipidemia   Relevant Orders   Lipid Panel   Essential hypertension - Primary   Relevant Orders   CMP   Other Visit Diagnoses     Routine general medical examination at a health care facility         Normal physical exam findings today, I reviewed his PHQ 9 score and pt reports he is "going through something" and the [place] is handling it. Offered verbal support. Some sleep disturbance at home  in connection to this issue, will monitor. Discussed healthy sleep habits and increasing exercise to help with stress. Return in 1 year (on 10/12/2023).     Karie Georges, MD

## 2022-10-12 NOTE — Patient Instructions (Signed)
Health Maintenance, Male Adopting a healthy lifestyle and getting preventive care are important in promoting health and wellness. Ask your health care provider about: The right schedule for you to have regular tests and exams. Things you can do on your own to prevent diseases and keep yourself healthy. What should I know about diet, weight, and exercise? Eat a healthy diet  Eat a diet that includes plenty of vegetables, fruits, low-fat dairy products, and lean protein. Do not eat a lot of foods that are high in solid fats, added sugars, or sodium. Maintain a healthy weight Body mass index (BMI) is a measurement that can be used to identify possible weight problems. It estimates body fat based on height and weight. Your health care provider can help determine your BMI and help you achieve or maintain a healthy weight. Get regular exercise Get regular exercise. This is one of the most important things you can do for your health. Most adults should: Exercise for at least 150 minutes each week. The exercise should increase your heart rate and make you sweat (moderate-intensity exercise). Do strengthening exercises at least twice a week. This is in addition to the moderate-intensity exercise. Spend less time sitting. Even light physical activity can be beneficial. Watch cholesterol and blood lipids Have your blood tested for lipids and cholesterol at 64 years of age, then have this test every 5 years. You may need to have your cholesterol levels checked more often if: Your lipid or cholesterol levels are high. You are older than 64 years of age. You are at high risk for heart disease. What should I know about cancer screening? Many types of cancers can be detected early and may often be prevented. Depending on your health history and family history, you may need to have cancer screening at various ages. This may include screening for: Colorectal cancer. Prostate cancer. Skin cancer. Lung  cancer. What should I know about heart disease, diabetes, and high blood pressure? Blood pressure and heart disease High blood pressure causes heart disease and increases the risk of stroke. This is more likely to develop in people who have high blood pressure readings or are overweight. Talk with your health care provider about your target blood pressure readings. Have your blood pressure checked: Every 3-5 years if you are 18-39 years of age. Every year if you are 40 years old or older. If you are between the ages of 65 and 75 and are a current or former smoker, ask your health care provider if you should have a one-time screening for abdominal aortic aneurysm (AAA). Diabetes Have regular diabetes screenings. This checks your fasting blood sugar level. Have the screening done: Once every three years after age 45 if you are at a normal weight and have a low risk for diabetes. More often and at a younger age if you are overweight or have a high risk for diabetes. What should I know about preventing infection? Hepatitis B If you have a higher risk for hepatitis B, you should be screened for this virus. Talk with your health care provider to find out if you are at risk for hepatitis B infection. Hepatitis C Blood testing is recommended for: Everyone born from 1945 through 1965. Anyone with known risk factors for hepatitis C. Sexually transmitted infections (STIs) You should be screened each year for STIs, including gonorrhea and chlamydia, if: You are sexually active and are younger than 64 years of age. You are older than 64 years of age and your   health care provider tells you that you are at risk for this type of infection. Your sexual activity has changed since you were last screened, and you are at increased risk for chlamydia or gonorrhea. Ask your health care provider if you are at risk. Ask your health care provider about whether you are at high risk for HIV. Your health care provider  may recommend a prescription medicine to help prevent HIV infection. If you choose to take medicine to prevent HIV, you should first get tested for HIV. You should then be tested every 3 months for as long as you are taking the medicine. Follow these instructions at home: Alcohol use Do not drink alcohol if your health care provider tells you not to drink. If you drink alcohol: Limit how much you have to 0-2 drinks a day. Know how much alcohol is in your drink. In the [place] one drink equals one 12 oz bottle of beer (355 mL), one 5 oz glass of wine (148 mL), or one 1 oz glass of hard liquor (44 mL). Lifestyle Do not use any products that contain nicotine or tobacco. These products include cigarettes, chewing tobacco, and vaping devices, such as e-cigarettes. If you need help quitting, ask your health care provider. Do not use street drugs. Do not share needles. Ask your health care provider for help if you need support or information about quitting drugs. General instructions Schedule regular health, dental, and eye exams. Stay current with your vaccines. Tell your health care provider if: You often feel depressed. You have ever been abused or do not feel safe at home. Summary Adopting a healthy lifestyle and getting preventive care are important in promoting health and wellness. Follow your health care provider's instructions about healthy diet, exercising, and getting tested or screened for diseases. Follow your health care provider's instructions on monitoring your cholesterol and blood pressure. This information is not intended to replace advice given to you by your health care provider. Make sure you discuss any questions you have with your health care provider. Document Revised: 11/10/2020 Document Reviewed: 11/10/2020 Elsevier Patient Education  2023 [employer]  

## 2023-03-14 ENCOUNTER — Encounter: Payer: 59 | Admitting: Family Medicine

## 2023-07-15 ENCOUNTER — Other Ambulatory Visit: Payer: Self-pay | Admitting: Family Medicine

## 2023-07-15 DIAGNOSIS — E782 Mixed hyperlipidemia: Secondary | ICD-10-CM

## 2023-07-15 DIAGNOSIS — I10 Essential (primary) hypertension: Secondary | ICD-10-CM

## 2023-10-13 ENCOUNTER — Other Ambulatory Visit: Payer: Self-pay | Admitting: Family Medicine

## 2023-10-13 DIAGNOSIS — E782 Mixed hyperlipidemia: Secondary | ICD-10-CM

## 2023-10-13 DIAGNOSIS — I10 Essential (primary) hypertension: Secondary | ICD-10-CM

## 2023-11-02 ENCOUNTER — Other Ambulatory Visit: Payer: Self-pay | Admitting: Family Medicine

## 2023-11-02 DIAGNOSIS — E782 Mixed hyperlipidemia: Secondary | ICD-10-CM

## 2023-11-02 DIAGNOSIS — I10 Essential (primary) hypertension: Secondary | ICD-10-CM

## 2023-11-03 ENCOUNTER — Encounter: Payer: Self-pay | Admitting: Family Medicine

## 2023-11-11 ENCOUNTER — Encounter: Payer: Self-pay | Admitting: Family Medicine

## 2023-11-11 ENCOUNTER — Ambulatory Visit: Admitting: Family Medicine

## 2023-11-11 VITALS — BP 110/70 | HR 73 | Temp 98.6°F | Ht 70.5 in | Wt 195.8 lb

## 2023-11-11 DIAGNOSIS — E782 Mixed hyperlipidemia: Secondary | ICD-10-CM

## 2023-11-11 DIAGNOSIS — Z0000 Encounter for general adult medical examination without abnormal findings: Secondary | ICD-10-CM

## 2023-11-11 DIAGNOSIS — I10 Essential (primary) hypertension: Secondary | ICD-10-CM

## 2023-11-11 MED ORDER — LISINOPRIL 20 MG PO TABS: 20.0000 mg | ORAL_TABLET | Freq: Every day | ORAL | 3 refills | Status: DC

## 2023-11-11 MED ORDER — ROSUVASTATIN CALCIUM 10 MG PO TABS: 10.0000 mg | ORAL_TABLET | Freq: Every day | ORAL | 3 refills | Status: DC

## 2023-11-11 NOTE — Progress Notes (Unsigned)
 Complete physical exam  Patient: Cody Simmons   DOB: 14-Mar-1961   63 y.o. Male  MRN: [id number]  Subjective:     Chief Complaint  Patient presents with   Medical Management of Chronic Issues    Patient declined PHQ-9 questions as [facility] is [facility] and prefers to separate treatment    Cody Simmons is a 63 y.o. male who presents today for a complete physical exam. He reports consuming a general diet. The patient does not participate in regular exercise at present. He generally feels well. He reports sleeping fairly well. He does not have additional problems to discuss today.   No vitamins or supplements, no herbal medications.  Pt is also here for follow up on HTN and HLD.   HTN -- BP in office performed and is well controlled. He  reports no side effects to the medications, no chest pain, SOB, dizziness or headaches. He has a BP cuff at home and is checking BP regularly, reports they are in the normal range. He needs refills today.   HLD -- pt reports compliance with his medications, on crestor  and needs refills. He denies any muscle cramps or other problems. Needs refills today.    Most recent fall risk assessment:    10/12/2022    8:17 AM  Fall Risk   Falls in the past year? 0  Number falls in past yr: 0  Injury with Fall? 0  Risk for fall due to : No Fall Risks  Follow up Falls evaluation completed     Most recent depression screenings:    11/11/2023    2:33 PM 10/12/2022    8:17 AM  PHQ 2/9 Scores  PHQ - 2 Score  2  PHQ- 9 Score  9  Exception Documentation Patient refusal     Vision:Within last year and Dental: No current dental problems and Receives regular dental care He is watching for glaucoma in the right eye Patient Active Problem List   Diagnosis Date Noted   History of vitrectomy 08/25/2021   Intermediate stage nonexudative age-related macular degeneration of left eye 08/18/2020   Cystoid macular edema of left eye  08/18/2020   Acute retinal necrosis, left eye 08/18/2020   Age-related nuclear cataract, right 08/18/2020   Cataract, right eye 05/28/2020   OSA (obstructive sleep apnea) 03/24/2016   Hypersomnia 01/14/2016   GERD (gastroesophageal reflux disease) 12/24/2014   Erectile disorder, acquired, situational, mild 12/24/2014   Mixed hyperlipidemia 06/07/2007   Essential hypertension 03/17/2007   Allergic rhinitis 03/17/2007      Patient Care Team: Aida House, MD as PCP - General (Family Medicine)   Outpatient Medications Prior to Visit  Medication Sig   buPROPion (WELLBUTRIN XL) 150 MG 24 hr tablet Take 150 mg by mouth daily. Take 1-2 tablets once a day   clonazePAM (KLONOPIN) 0.5 MG disintegrating tablet Take 1 mg by mouth daily.   famciclovir  (FAMVIR ) 500 MG tablet Take 1 tablet (500 mg total) by mouth daily.   folic acid  (FOLATE) 400 MCG tablet Take 1 tablet (400 mcg total) by mouth daily.   ketorolac (ACULAR) 0.5 % ophthalmic solution INSTILL 1 DROP INTO THE  LEFT EYE TWICE DAILY   meloxicam (MOBIC) 7.5 MG tablet Take 7.5 mg by mouth 2 (two) times daily.   vitamin B-12 (CYANOCOBALAMIN ) 500 MCG tablet Take 500 mcg by mouth daily.   [DISCONTINUED] lisinopril  (ZESTRIL ) 20 MG tablet TAKE 1 TABLET BY MOUTH DAILY   [  DISCONTINUED] rosuvastatin  (CRESTOR ) 10 MG tablet TAKE 1 TABLET BY MOUTH DAILY   fluorometholone  (EFLONE) 0.1 % ophthalmic suspension Place 1 drop into the left eye every other day.   traZODone (DESYREL) 50 MG tablet Take 50 mg by mouth at bedtime. Take 1-3 tablets by mouth at bedtime (Patient not taking: Reported on 11/11/2023)   No facility-administered medications prior to visit.    Review of Systems  HENT:  Negative for hearing loss.   Eyes:  Negative for blurred vision.  Respiratory:  Negative for shortness of breath.   Cardiovascular:  Negative for chest pain.  Gastrointestinal: Negative.   Genitourinary: Negative.   Musculoskeletal:  Negative for back pain.   Neurological:  Negative for headaches.  Psychiatric/Behavioral:  Negative for depression.        Objective:     BP 110/70   Pulse 73   Temp 98.6 F (37 C) (Oral)   Ht 5' 10.5" (1.791 m)   Wt 195 lb 12.8 oz (88.8 kg)   SpO2 97%   BMI 27.70 kg/m    Physical Exam Vitals reviewed.  Constitutional:      Appearance: Normal appearance. He is well-groomed and normal weight.  HENT:     Right Ear: Tympanic membrane and ear canal normal.     Left Ear: Tympanic membrane and ear canal normal.     Mouth/Throat:     Mouth: Mucous membranes are moist.     Pharynx: No posterior oropharyngeal erythema.  Eyes:     Extraocular Movements: Extraocular movements intact.     Conjunctiva/sclera: Conjunctivae normal.  Neck:     Thyroid : No thyromegaly.  Cardiovascular:     Rate and Rhythm: Normal rate and regular rhythm.     Heart sounds: S1 normal and S2 normal. No murmur heard. Pulmonary:     Effort: Pulmonary effort is normal.     Breath sounds: Normal breath sounds and air entry. No rales.  Abdominal:     General: Abdomen is flat. Bowel sounds are normal.  Musculoskeletal:     Right lower leg: No edema.     Left lower leg: No edema.  Lymphadenopathy:     Cervical: No cervical adenopathy.  Neurological:     General: No focal deficit present.     Mental Status: He is alert and oriented to person, place, and time.     Gait: Gait is intact.  Psychiatric:        Mood and Affect: Mood and affect normal.      No results found for any visits on 11/11/23.     Assessment & Plan:    Routine Health Maintenance and Physical Exam  Immunization History  Administered Date(s) Administered   Influenza,inj,Quad PF,6+ Mos 04/04/2018, 07/09/2020   Influenza-Unspecified 05/06/2015   PFIZER Comirnaty(Gray Top)Covid-19 Tri-Sucrose Vaccine 12/10/2020   PFIZER(Purple Top)SARS-COV-2 Vaccination 09/13/2019, 10/04/2019, 02/21/2020, 06/10/2020   Pfizer Covid-19 Vaccine Bivalent Booster 40yrs & up  06/04/2021   Td 02/03/1999, 12/04/2008   Tdap 10/10/2017   Zoster Recombinant(Shingrix) 07/09/2020, 09/08/2020    Health Maintenance  Topic Date Due   COVID-19 Vaccine (7 - 2024-25 season) 03/06/2023   INFLUENZA VACCINE  02/03/2024   Colonoscopy  07/31/2027   DTaP/Tdap/Td (4 - Td or Tdap) 10/11/2027   Hepatitis C Screening  Completed   HIV Screening  Completed   Zoster Vaccines- Shingrix  Completed   HPV VACCINES  Aged Out   Meningococcal B Vaccine  Aged Out    Discussed health benefits of physical activity,  and encouraged him to engage in regular exercise appropriate for his age and condition.  Routine general medical examination at a health care facility  Normal physical exam findings today. He is UTD on his health maintenance and vaccinations. I counseled the patient on getting 150 minutes of exercise per week as per the CDC recommendations. Handouts given on healthy eating and exercise. Labs ordered for annual surveillance of kidney function and cholesterol numbers.   Mixed hyperlipidemia Assessment & Plan: He is due for his annual labs today, rechecking lipid panel, continue crestor  10 mg daily as prescribed. Refills sent to pharmacy.   Orders: -     Lipid panel; Future -     Rosuvastatin  Calcium ; Take 1 tablet (10 mg total) by mouth daily.  Dispense: 90 tablet; Refill: 3  Essential hypertension Assessment & Plan: Current hypertension medications:       Sig   lisinopril  (ZESTRIL ) 20 MG tablet Take 1 tablet (20 mg total) by mouth daily.      BP is well controlled on the above medication, will continue this as prescribed, refills sent to pharmacy  Orders: -     Comprehensive metabolic panel with GFR; Future -     Lisinopril ; Take 1 tablet (20 mg total) by mouth daily.  Dispense: 90 tablet; Refill: 3    Return in about 1 year (around 11/10/2024) for annual physical exam.     Aida House, MD

## 2023-11-15 ENCOUNTER — Other Ambulatory Visit (INDEPENDENT_AMBULATORY_CARE_PROVIDER_SITE_OTHER)

## 2023-11-15 DIAGNOSIS — E782 Mixed hyperlipidemia: Secondary | ICD-10-CM | POA: Diagnosis not present

## 2023-11-15 DIAGNOSIS — I10 Essential (primary) hypertension: Secondary | ICD-10-CM

## 2023-11-15 LAB — LIPID PANEL
Cholesterol: 120 mg/dL (ref 0–200)
HDL: 35.1 mg/dL — ABNORMAL LOW (ref >39.00)
LDL Cholesterol: 49 mg/dL (ref 0–99)
NonHDL: 85.33
Total CHOL/HDL Ratio: 3
Triglycerides: 184 mg/dL — ABNORMAL HIGH (ref 0.0–149.0)
VLDL: 36.8 mg/dL (ref 0.0–40.0)

## 2023-11-15 LAB — COMPREHENSIVE METABOLIC PANEL WITH GFR
ALT: 25 U/L (ref 0–53)
AST: 20 U/L (ref 0–37)
Albumin: 4.1 g/dL (ref 3.5–5.2)
Alkaline Phosphatase: 66 U/L (ref 39–117)
BUN: 15 mg/dL (ref 6–23)
CO2: 23 meq/L (ref 19–32)
Calcium: 8.9 mg/dL (ref 8.4–10.5)
Chloride: 109 meq/L (ref 96–112)
Creatinine, Ser: 1.53 mg/dL — ABNORMAL HIGH (ref 0.40–1.50)
GFR: 47.75 mL/min — ABNORMAL LOW (ref >60.00)
Glucose, Bld: 90 mg/dL (ref 70–99)
Potassium: 3.7 meq/L (ref 3.5–5.1)
Sodium: 139 meq/L (ref 135–145)
Total Bilirubin: 0.8 mg/dL (ref 0.2–1.2)
Total Protein: 6.7 g/dL (ref 6.0–8.3)

## 2023-11-17 ENCOUNTER — Ambulatory Visit: Payer: Self-pay | Admitting: Family Medicine

## 2023-11-17 NOTE — Assessment & Plan Note (Signed)
He is due for his annual labs today, rechecking lipid panel, continue crestor 10 mg daily as prescribed. Refills sent to pharmacy.

## 2023-11-17 NOTE — Assessment & Plan Note (Signed)
Current hypertension medications:       Sig   lisinopril (ZESTRIL) 20 MG tablet Take 1 tablet (20 mg total) by mouth daily.      BP is well controlled on the above medication, will continue this as prescribed, refills sent to pharmacy

## 2023-11-20 ENCOUNTER — Other Ambulatory Visit: Payer: Self-pay | Admitting: Family Medicine

## 2023-11-20 DIAGNOSIS — I10 Essential (primary) hypertension: Secondary | ICD-10-CM

## 2023-11-20 DIAGNOSIS — E782 Mixed hyperlipidemia: Secondary | ICD-10-CM

## 2024-01-04 ENCOUNTER — Other Ambulatory Visit: Payer: Self-pay | Admitting: Family Medicine

## 2024-01-04 ENCOUNTER — Telehealth: Payer: Self-pay | Admitting: *Deleted

## 2024-01-04 MED ORDER — MELOXICAM 7.5 MG PO TABS: 7.5000 mg | ORAL_TABLET | Freq: Two times a day (BID) | ORAL | 1 refills | Status: DC

## 2024-01-04 NOTE — Telephone Encounter (Signed)
 [facility] faxed a refill request for Meloxicam.  Message sent to PCP as the Rx is listed as an historical med.

## 2024-05-15 ENCOUNTER — Other Ambulatory Visit: Payer: Self-pay | Admitting: Family Medicine
